# Patient Record
Sex: Male | Born: 1945
Health system: Southern US, Community
[De-identification: ages and names within clinical notes are randomized; demographics above are authoritative.]

## PROBLEM LIST (undated history)

## (undated) DIAGNOSIS — D61818 Other pancytopenia: Secondary | ICD-10-CM

## (undated) DIAGNOSIS — D539 Nutritional anemia, unspecified: Secondary | ICD-10-CM

## (undated) DIAGNOSIS — Z87442 Personal history of urinary calculi: Secondary | ICD-10-CM

## (undated) DIAGNOSIS — D510 Vitamin B12 deficiency anemia due to intrinsic factor deficiency: Secondary | ICD-10-CM

## (undated) DIAGNOSIS — E785 Hyperlipidemia, unspecified: Secondary | ICD-10-CM

## (undated) DIAGNOSIS — E538 Deficiency of other specified B group vitamins: Secondary | ICD-10-CM

## (undated) HISTORY — DX: Nutritional anemia, unspecified: D53.9

## (undated) HISTORY — DX: Hyperlipidemia, unspecified: E78.5

## (undated) HISTORY — DX: Other pancytopenia: D61.818

## (undated) HISTORY — DX: Vitamin B12 deficiency anemia due to intrinsic factor deficiency: D51.0

---

## 2006-04-24 ENCOUNTER — Emergency Department: Payer: Self-pay | Admitting: Emergency Medicine

## 2007-07-01 IMAGING — CR RIGHT FOOT COMPLETE - 3+ VIEW
1 series · 3 of 3 positions shown · non-contrast
Comparison: none

REASON FOR EXAM: pain trauma
COMMENTS:  LMP: (Male)

[Series 1: view not recorded · 0.17mm/px · 3 of 3 slices shown]
[im 1/3]
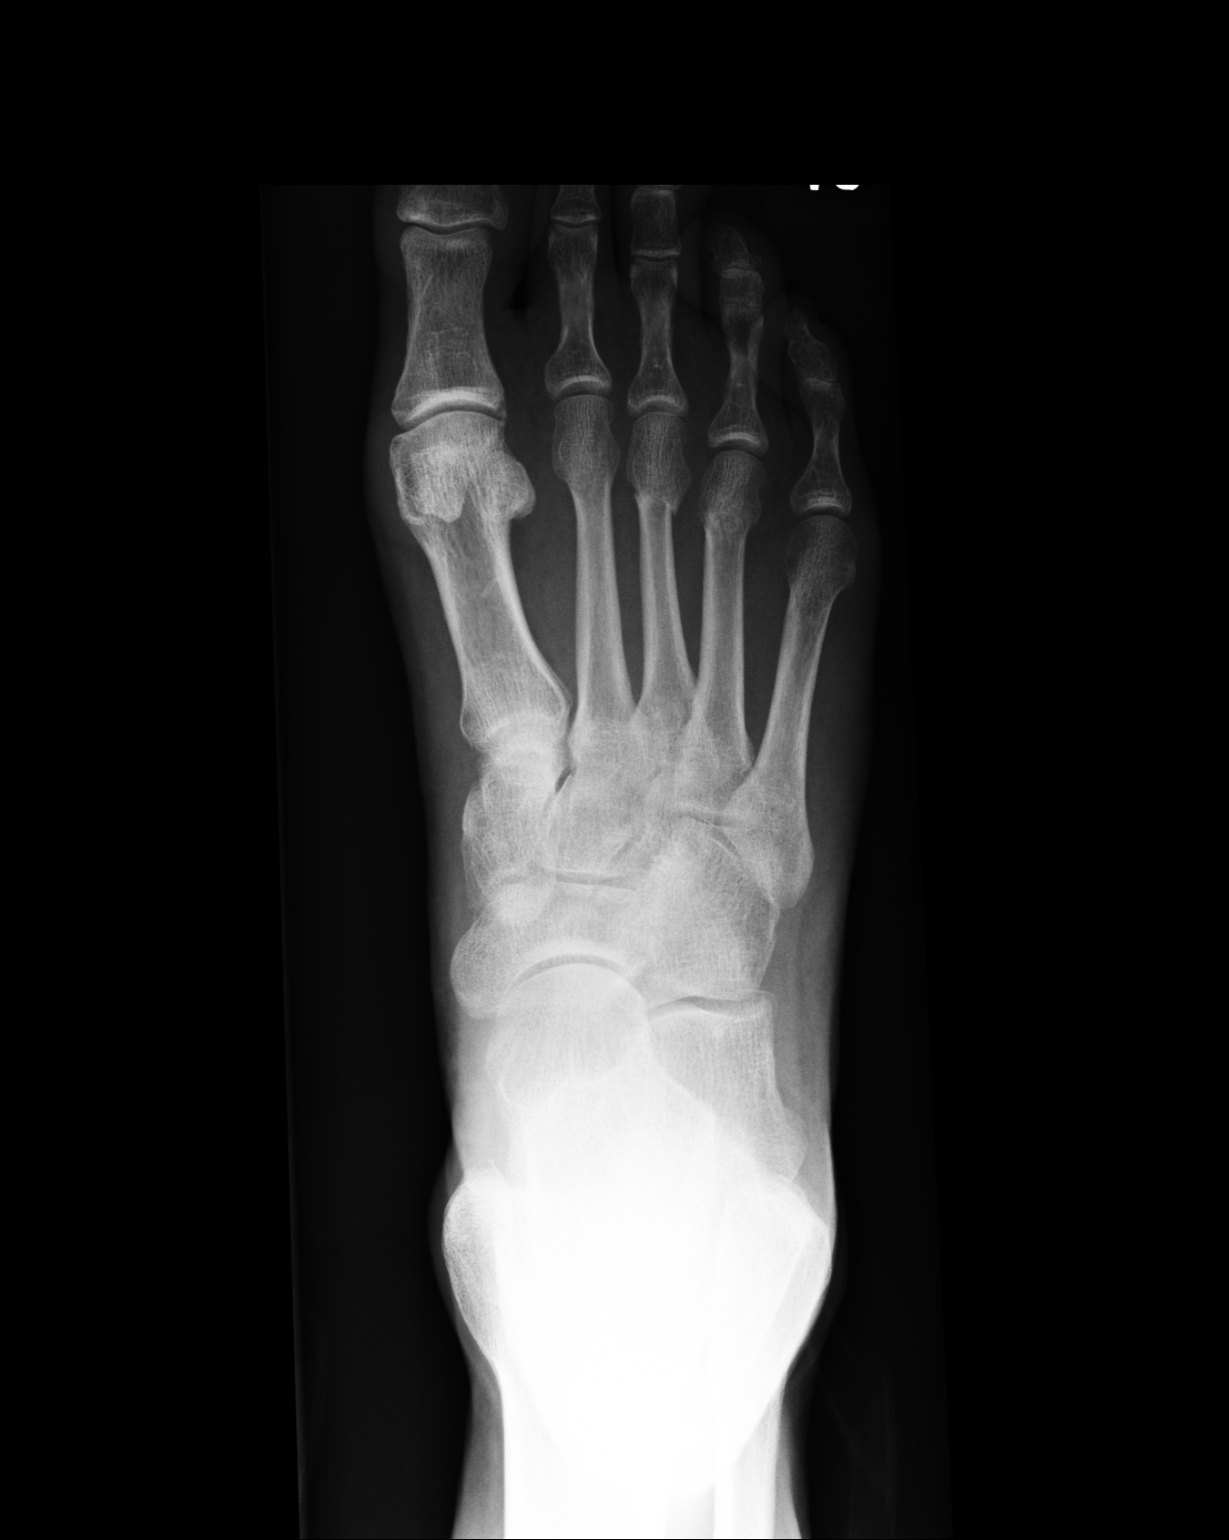
[im 2/3]
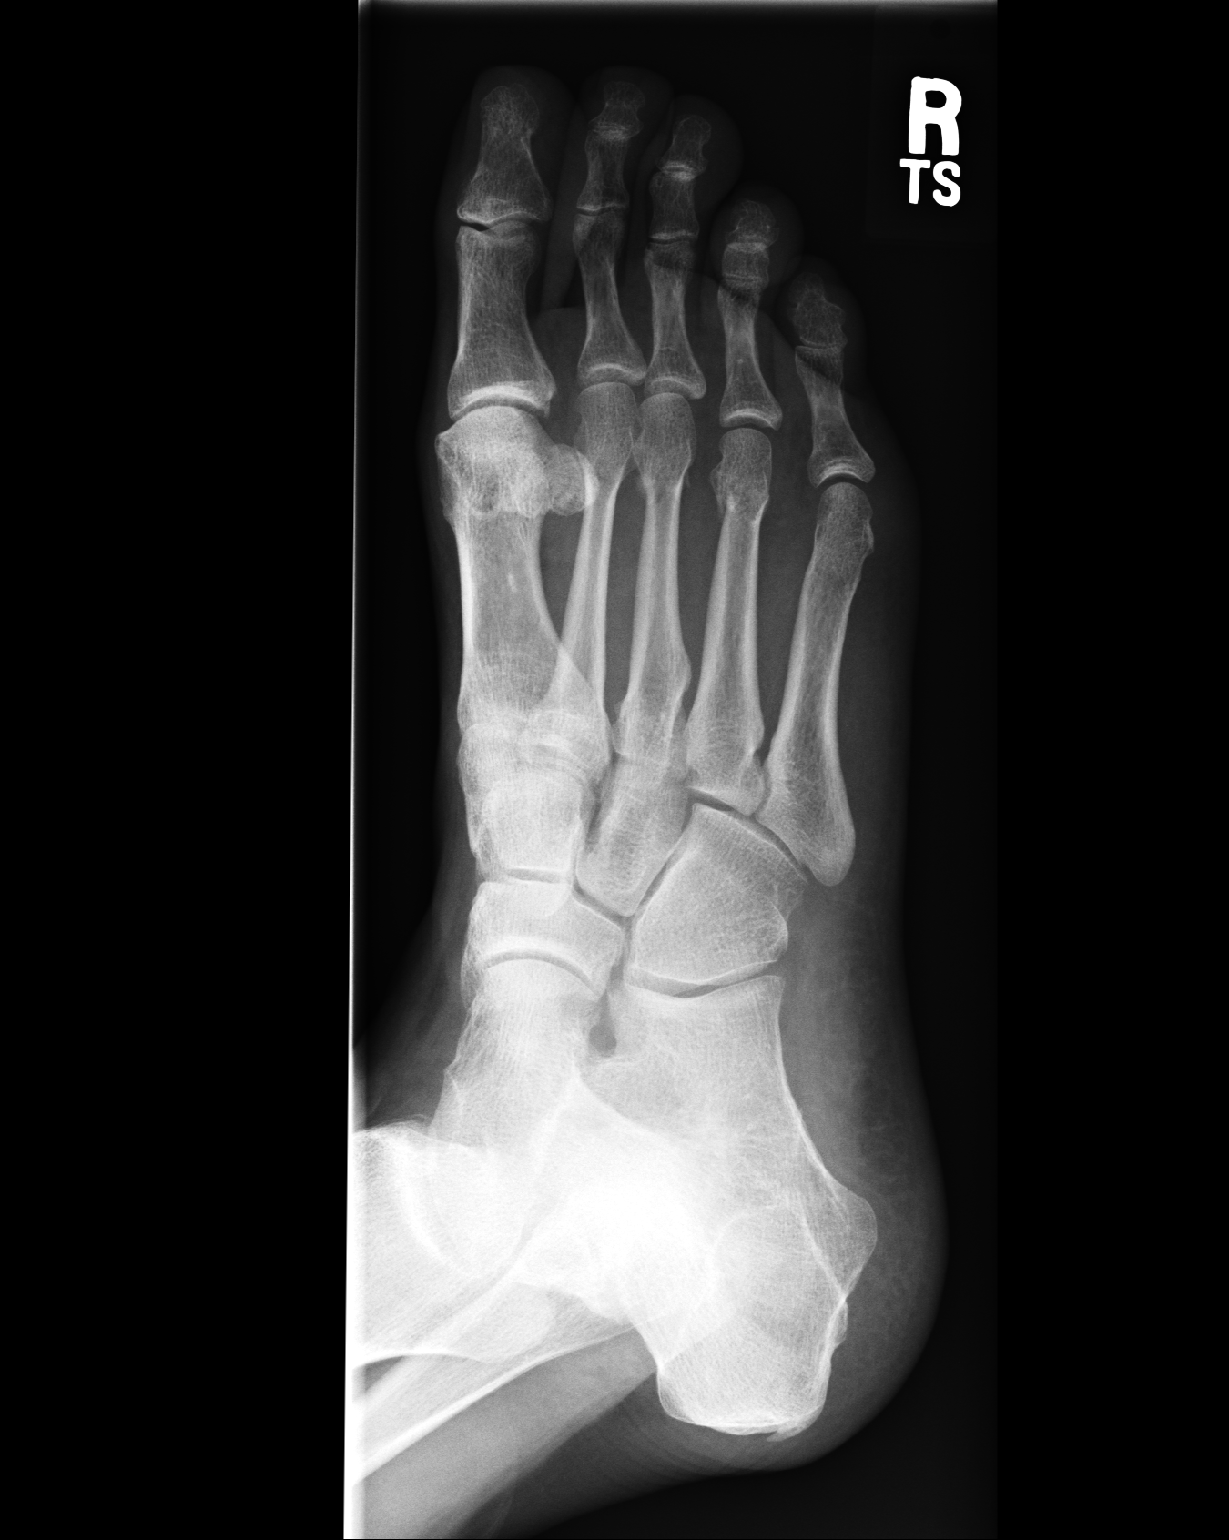
[im 3/3]
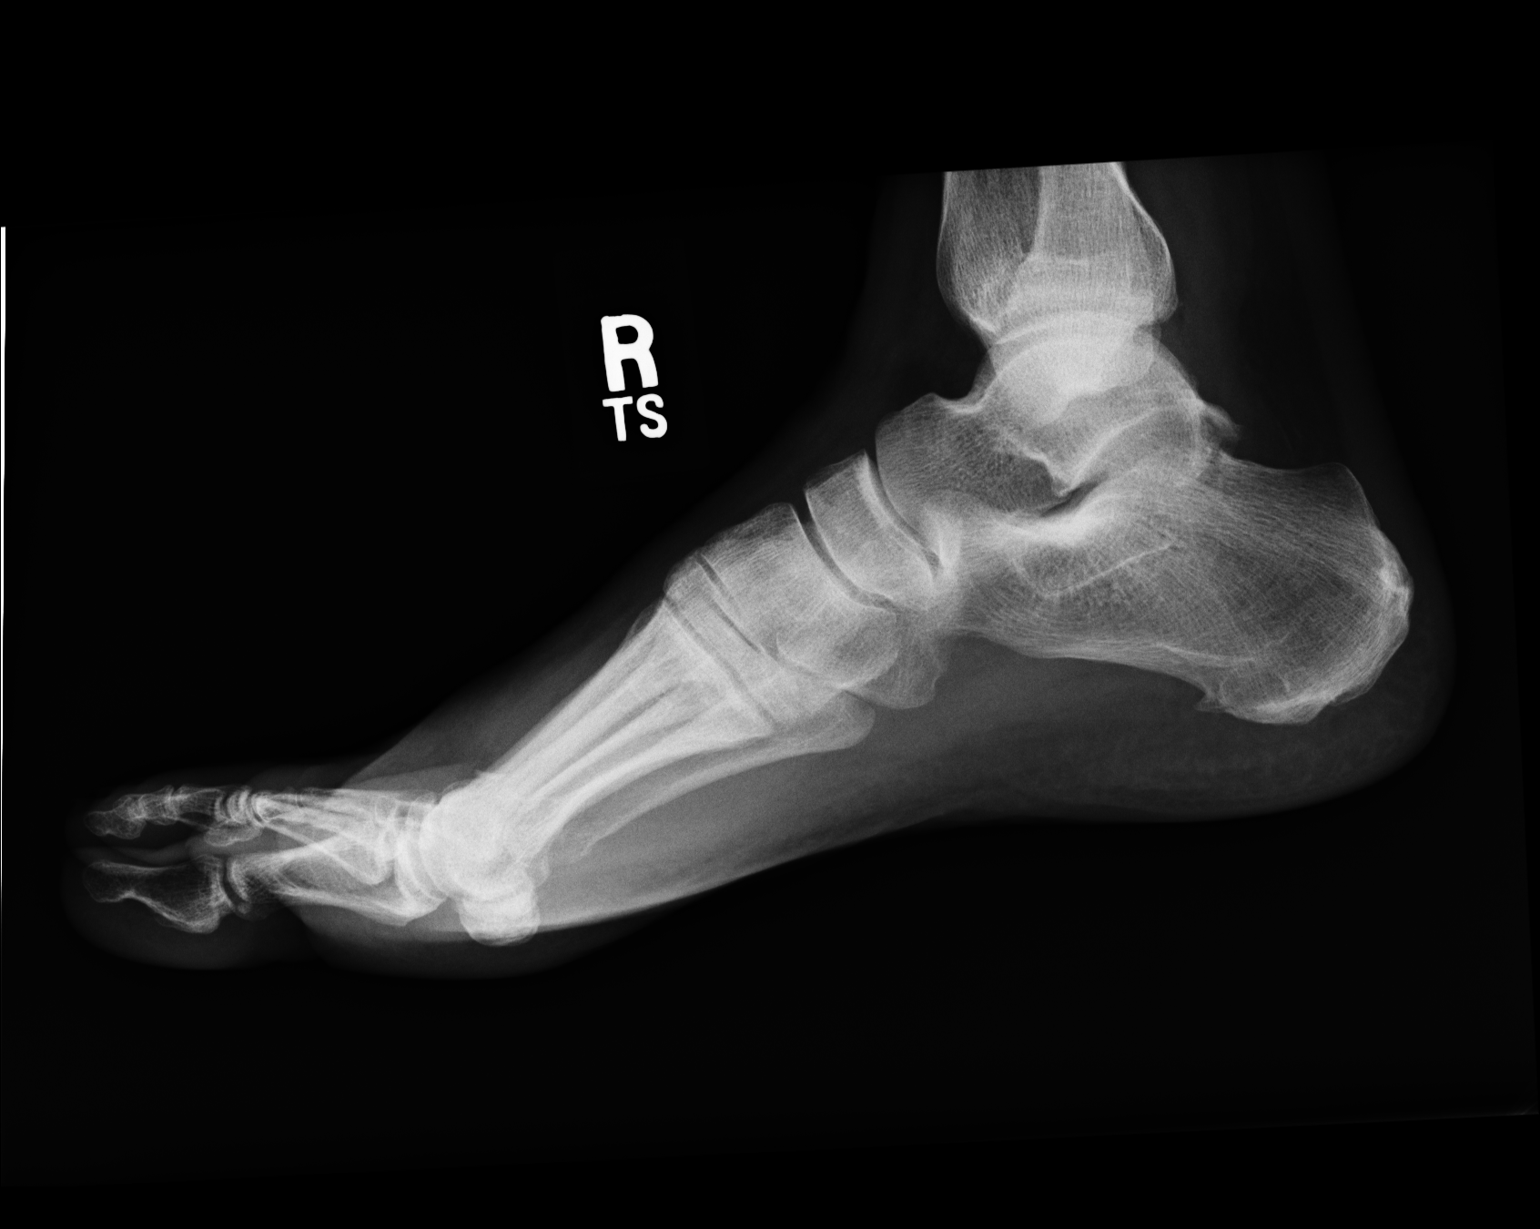

[3 of 3 positions shown; findings below may reference images not displayed]

PROCEDURE:     DXR - DXR FOOT RT COMPLETE W/OBLIQUES  - April 24, 2006  [DATE]

RESULT:          There are noted essentially nondisplaced fractures of the
distal third and fourth metatarsals.  There is a possible hairline fracture
of the distal second metatarsal, but this is not definite.  No other
fractures are seen.
IMPRESSION: Please see above.

## 2007-09-03 ENCOUNTER — Emergency Department: Payer: Self-pay | Admitting: Emergency Medicine

## 2007-09-30 HISTORY — PX: COLONOSCOPY WITH PROPOFOL: SHX5780

## 2007-09-30 LAB — HM COLONOSCOPY: HM Colonoscopy: NORMAL

## 2008-02-28 ENCOUNTER — Ambulatory Visit: Payer: Self-pay | Admitting: Internal Medicine

## 2008-03-12 ENCOUNTER — Inpatient Hospital Stay: Payer: Self-pay | Admitting: *Deleted

## 2008-03-12 ENCOUNTER — Other Ambulatory Visit: Payer: Self-pay

## 2008-03-16 ENCOUNTER — Ambulatory Visit: Payer: Self-pay | Admitting: Internal Medicine

## 2008-03-29 ENCOUNTER — Ambulatory Visit: Payer: Self-pay | Admitting: Internal Medicine

## 2008-04-05 ENCOUNTER — Ambulatory Visit: Payer: Self-pay | Admitting: Gastroenterology

## 2008-04-29 ENCOUNTER — Ambulatory Visit: Payer: Self-pay | Admitting: Internal Medicine

## 2008-05-30 ENCOUNTER — Ambulatory Visit: Payer: Self-pay | Admitting: Internal Medicine

## 2008-11-09 IMAGING — CR RIGHT FOOT COMPLETE - 3+ VIEW
1 series · 3 of 3 positions shown · non-contrast
Comparison: none

REASON FOR EXAM: injury
COMMENTS:

PROCEDURE:     DXR - DXR FOOT RT COMPLETE W/OBLIQUES  - September 03, 2007 [DATE]
RESULT:     There is no evidence of fracture, dislocation, or malalignment.
If there are persistent complaints of pain or persistent clinical concern,
repeat evaluation in 7-10 days is recommended.

[Series 1: view not recorded · 0.17mm/px · 3 of 3 slices shown]
[im 1/3]
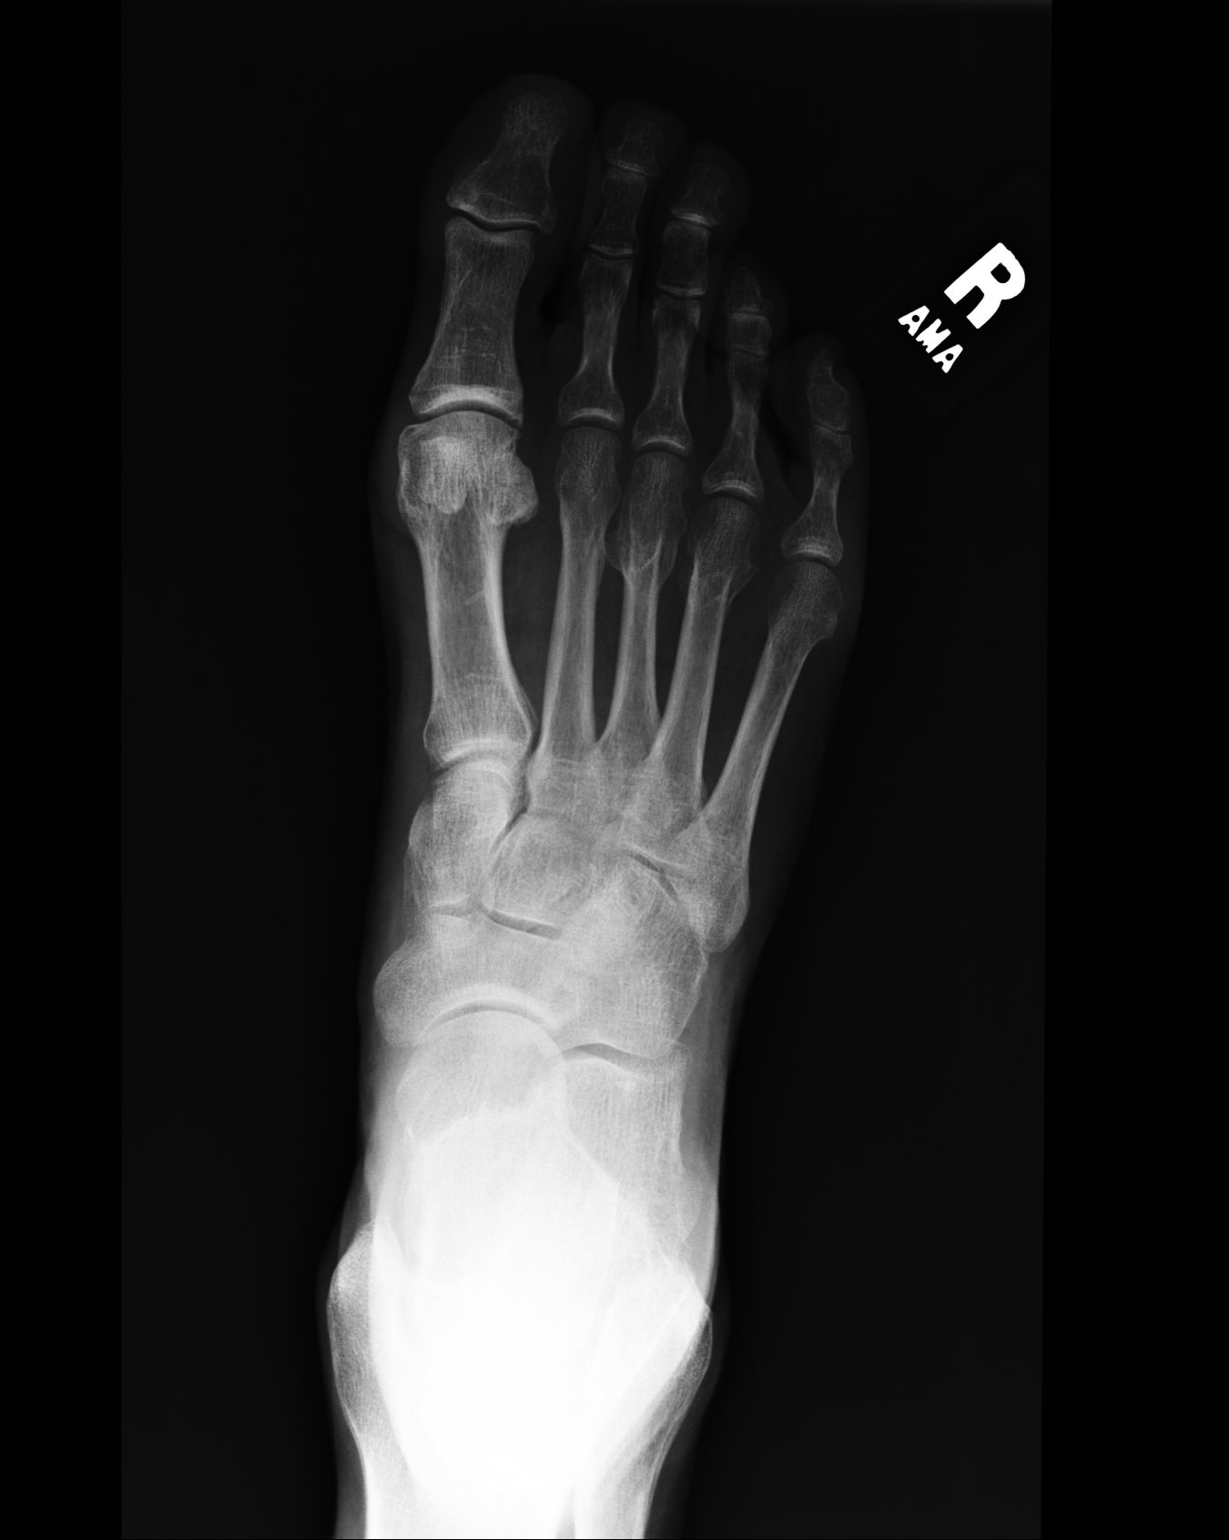
[im 2/3]
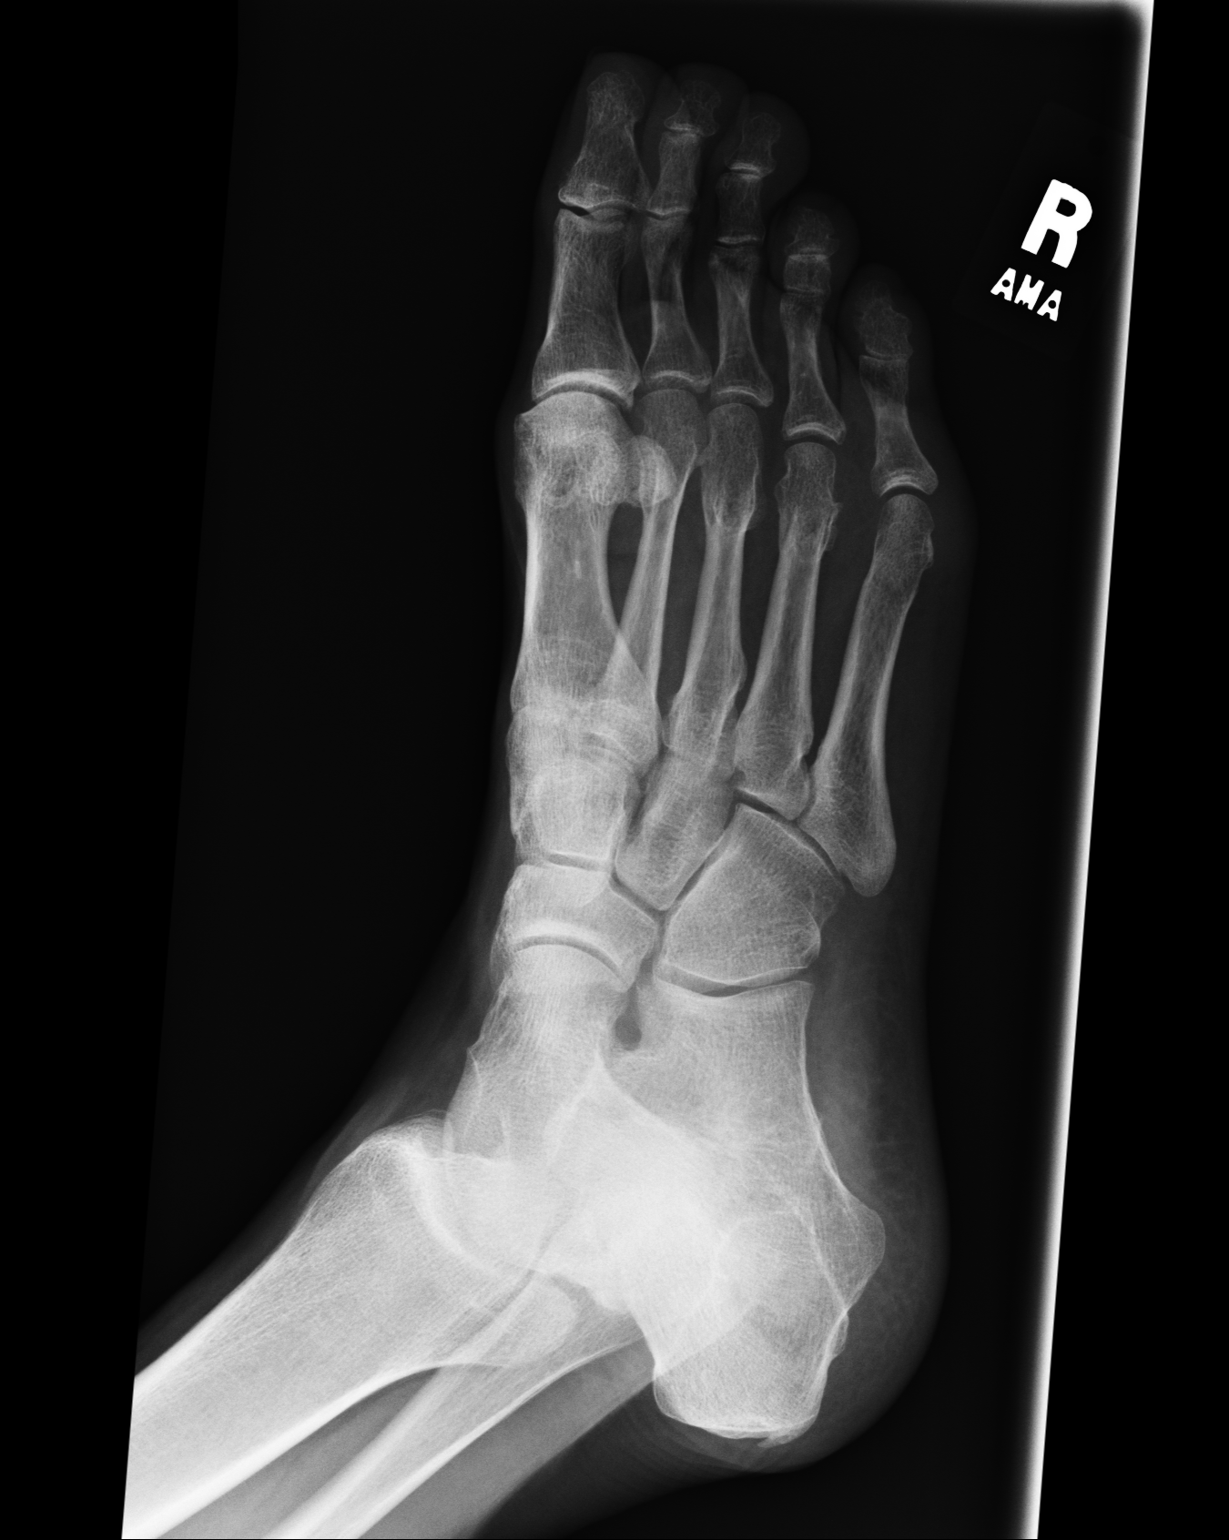
[im 3/3]
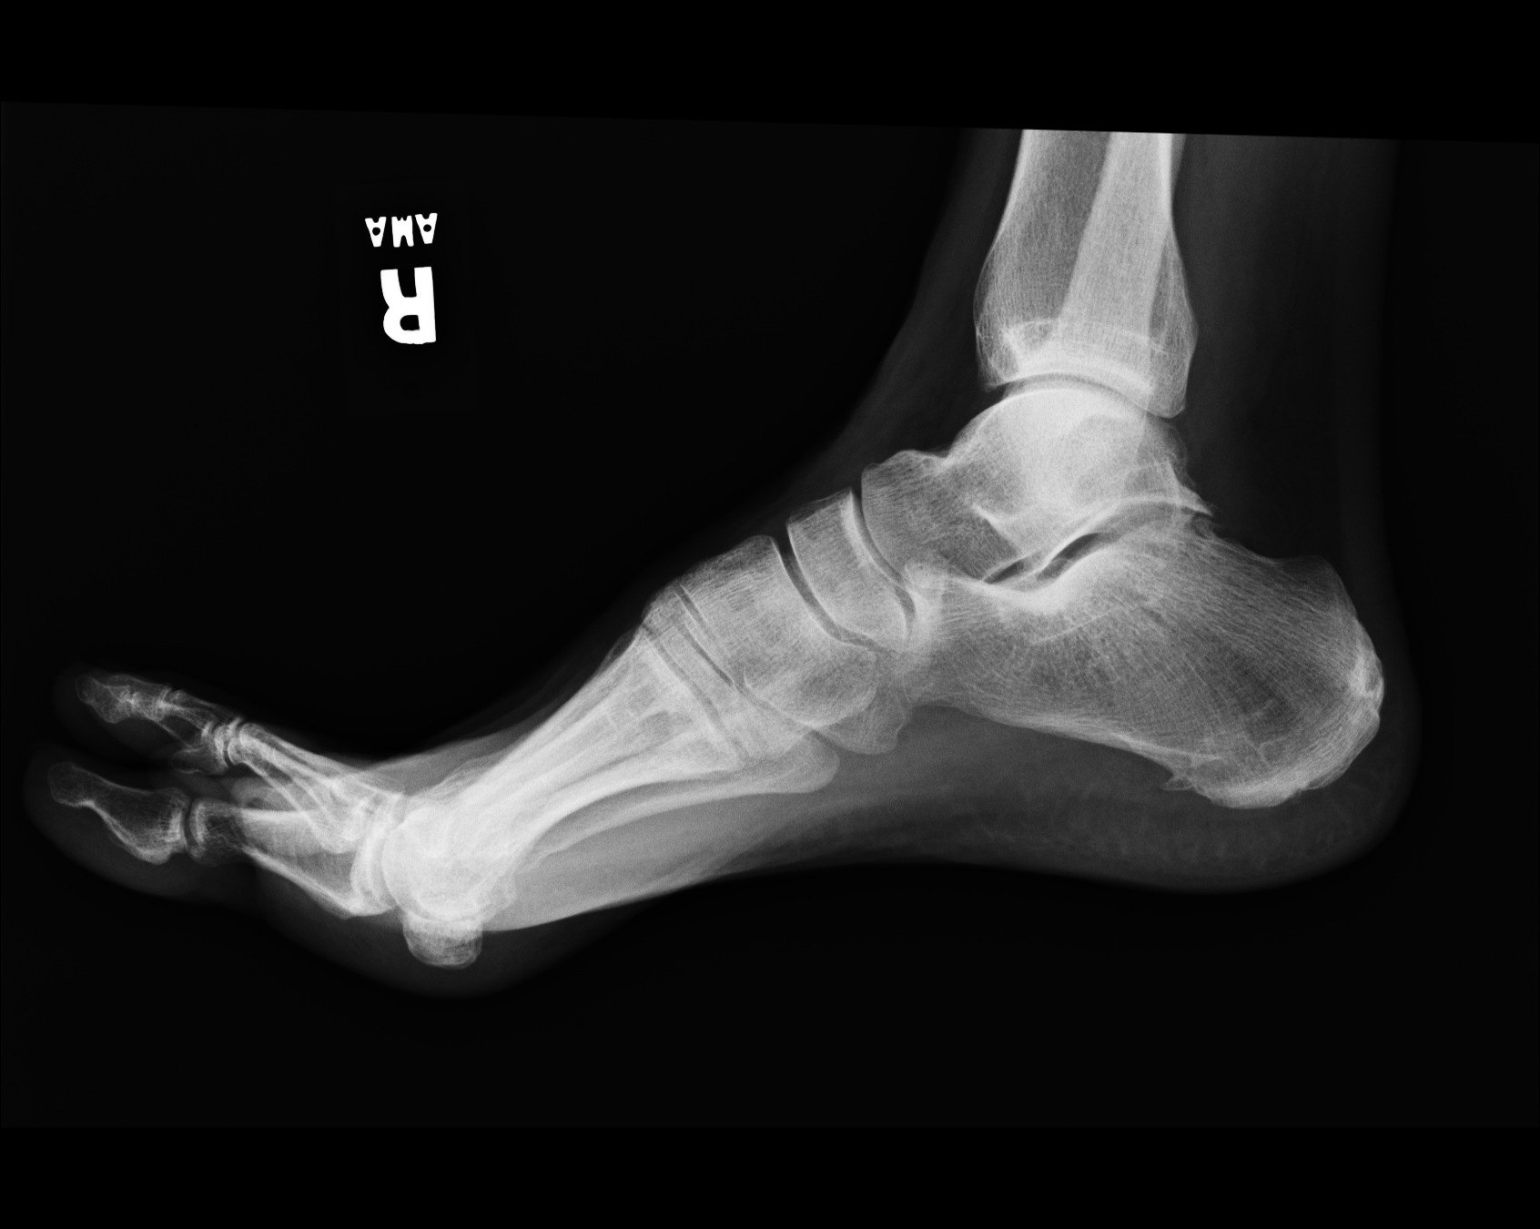

[3 of 3 positions shown; findings below may reference images not displayed]

IMPRESSION: 1)No evidence of focal acute abnormalities of the RIGHT foot as described
above.

## 2009-12-22 ENCOUNTER — Emergency Department: Payer: Self-pay | Admitting: Unknown Physician Specialty

## 2013-05-17 ENCOUNTER — Ambulatory Visit: Payer: Self-pay | Admitting: Family Medicine

## 2013-05-19 ENCOUNTER — Ambulatory Visit: Payer: Self-pay | Admitting: Emergency Medicine

## 2013-05-24 ENCOUNTER — Ambulatory Visit: Payer: Self-pay | Admitting: Family Medicine

## 2015-12-18 NOTE — Telephone Encounter (Signed)
Error

## 2016-01-08 DIAGNOSIS — E785 Hyperlipidemia, unspecified: Secondary | ICD-10-CM | POA: Insufficient documentation

## 2016-01-08 DIAGNOSIS — D510 Vitamin B12 deficiency anemia due to intrinsic factor deficiency: Secondary | ICD-10-CM | POA: Insufficient documentation

## 2016-01-29 ENCOUNTER — Other Ambulatory Visit: Payer: Self-pay | Admitting: Family Medicine

## 2016-01-29 ENCOUNTER — Other Ambulatory Visit: Payer: Self-pay

## 2016-01-29 ENCOUNTER — Encounter: Payer: Self-pay | Admitting: Family Medicine

## 2016-01-29 ENCOUNTER — Ambulatory Visit (INDEPENDENT_AMBULATORY_CARE_PROVIDER_SITE_OTHER): Payer: BLUE CROSS/BLUE SHIELD | Admitting: Family Medicine

## 2016-01-29 VITALS — BP 106/63 | HR 61 | Temp 97.7°F | Ht 68.8 in | Wt 167.0 lb

## 2016-01-29 DIAGNOSIS — R5383 Other fatigue: Secondary | ICD-10-CM

## 2016-01-29 DIAGNOSIS — N400 Enlarged prostate without lower urinary tract symptoms: Secondary | ICD-10-CM

## 2016-01-29 DIAGNOSIS — D510 Vitamin B12 deficiency anemia due to intrinsic factor deficiency: Secondary | ICD-10-CM | POA: Diagnosis not present

## 2016-01-29 DIAGNOSIS — Z0000 Encounter for general adult medical examination without abnormal findings: Secondary | ICD-10-CM

## 2016-01-29 DIAGNOSIS — E785 Hyperlipidemia, unspecified: Secondary | ICD-10-CM | POA: Diagnosis not present

## 2016-01-29 DIAGNOSIS — D500 Iron deficiency anemia secondary to blood loss (chronic): Secondary | ICD-10-CM | POA: Diagnosis not present

## 2016-01-29 MED ORDER — CYANOCOBALAMIN 1000 MCG/ML IJ SOLN: 1000.0000 ug | Freq: Once | INTRAMUSCULAR | Status: DC

## 2016-01-29 NOTE — Progress Notes (Signed)
   BP 106/63 mmHg  Pulse 61  Temp(Src) 97.7 F (36.5 C)  Ht 5' 8.8" (1.748 m)  Wt 167 lb (75.751 kg)  BMI 24.79 kg/m2  SpO2 99%   Subjective:    Patient ID: Donald Savage, male    DOB: 03/07/1946, 70 y.o.   MRN: [id number]  HPI: Donald Savage is a 70 y.o. male  Chief Complaint  Patient presents with  . Annual Exam   Patient with pernicious anemia doing well getting B12 shots once a month without problems Hypercholesterol treating with diet has no family history of coronary artery disease Not taking medications. Otherwise doing well. Relevant past medical, surgical, family and social history reviewed and updated as indicated. Interim medical history since our last visit reviewed. Allergies and medications reviewed and updated.  Review of Systems  Constitutional: Negative.   HENT: Negative.   Eyes: Negative.   Respiratory: Negative.   Cardiovascular: Negative.   Gastrointestinal: Negative.   Endocrine: Negative.   Genitourinary: Negative.   Musculoskeletal: Negative.   Skin: Negative.   Allergic/Immunologic: Negative.   Neurological: Negative.   Hematological: Negative.   Psychiatric/Behavioral: Negative.     Per HPI unless specifically indicated above     Objective:    BP 106/63 mmHg  Pulse 61  Temp(Src) 97.7 F (36.5 C)  Ht 5' 8.8" (1.748 m)  Wt 167 lb (75.751 kg)  BMI 24.79 kg/m2  SpO2 99%  Wt Readings from Last 3 Encounters:  01/29/16 167 lb (75.751 kg)  01/25/15 161 lb (73.029 kg)    Physical Exam  Constitutional: He is oriented to person, place, and time. He appears well-developed and well-nourished.  HENT:  Head: Normocephalic and atraumatic.  Right Ear: External ear normal.  Left Ear: External ear normal.  Eyes: Conjunctivae and EOM are normal. Pupils are equal, round, and reactive to light.  Neck: Normal range of motion. Neck supple.  Cardiovascular: Normal rate, regular rhythm, normal heart sounds and intact distal pulses.    Pulmonary/Chest: Effort normal and breath sounds normal.  Abdominal: Soft. Bowel sounds are normal. There is no splenomegaly or hepatomegaly.  Genitourinary: Rectum normal, prostate normal and penis normal.  Musculoskeletal: Normal range of motion.  Neurological: He is alert and oriented to person, place, and time. He has normal reflexes.  Skin: No rash noted. No erythema.  Psychiatric: He has a normal mood and affect. His behavior is normal. Judgment and thought content normal.    Results for orders placed or performed in visit on 01/08/16  HM COLONOSCOPY  Result Value Ref Range   HM Colonoscopy Patient Reported Normal See Report, Patient Reported Normal      Assessment & Plan:   Problem List Items Addressed This Visit      Other   Hyperlipidemia    Diet controled      Pernicious anemia    Doing well with B12 self injected once a month      Relevant Medications   cyanocobalamin (,VITAMIN B-12,) 1000 MCG/ML injection    Other Visit Diagnoses    Routine general medical examination at a health care facility    -  Primary        Follow up plan: Return in about 1 year (around 01/28/2017) for Physical Exam.

## 2016-01-29 NOTE — Assessment & Plan Note (Signed)
Diet controled 

## 2016-01-29 NOTE — Assessment & Plan Note (Signed)
Doing well with B12 self injected once a month

## 2016-01-30 ENCOUNTER — Telehealth: Payer: Self-pay | Admitting: Family Medicine

## 2016-01-30 DIAGNOSIS — R7989 Other specified abnormal findings of blood chemistry: Secondary | ICD-10-CM

## 2016-01-30 LAB — URINALYSIS, ROUTINE W REFLEX MICROSCOPIC
Bilirubin, UA: NEGATIVE
GLUCOSE, UA: NEGATIVE
Ketones, UA: NEGATIVE
Leukocytes, UA: NEGATIVE
NITRITE UA: NEGATIVE
PH UA: 6.5 (ref 5.0–7.5)
RBC, UA: NEGATIVE
Specific Gravity, UA: 1.02 (ref 1.005–1.030)
UUROB: 1 mg/dL (ref 0.2–1.0)

## 2016-01-30 LAB — MICROSCOPIC EXAMINATION
Epithelial Cells (non renal): NONE SEEN /hpf (ref 0–10)
RBC MICROSCOPIC, UA: NONE SEEN /HPF (ref 0–?)

## 2016-01-30 LAB — CBC WITH DIFFERENTIAL/PLATELET
Basophils Absolute: 0 10*3/uL (ref 0.0–0.2)
Basos: 1 %
EOS (ABSOLUTE): 0.1 10*3/uL (ref 0.0–0.4)
EOS: 1 %
HEMATOCRIT: 40.2 % (ref 37.5–51.0)
HEMOGLOBIN: 13.5 g/dL (ref 12.6–17.7)
Immature Grans (Abs): 0 10*3/uL (ref 0.0–0.1)
Immature Granulocytes: 0 %
LYMPHS ABS: 1.3 10*3/uL (ref 0.7–3.1)
Lymphs: 30 %
MCH: 29.1 pg (ref 26.6–33.0)
MCHC: 33.6 g/dL (ref 31.5–35.7)
MCV: 87 fL (ref 79–97)
MONOCYTES: 8 %
MONOS ABS: 0.3 10*3/uL (ref 0.1–0.9)
NEUTROS ABS: 2.6 10*3/uL (ref 1.4–7.0)
Neutrophils: 60 %
Platelets: 162 10*3/uL (ref 150–379)
RBC: 4.64 x10E6/uL (ref 4.14–5.80)
RDW: 13.8 % (ref 12.3–15.4)
WBC: 4.3 10*3/uL (ref 3.4–10.8)

## 2016-01-30 LAB — COMPREHENSIVE METABOLIC PANEL
A/G RATIO: 1.7 (ref 1.2–2.2)
ALBUMIN: 4.2 g/dL (ref 3.5–4.8)
ALK PHOS: 66 IU/L (ref 39–117)
ALT: 16 IU/L (ref 0–44)
AST: 20 IU/L (ref 0–40)
BILIRUBIN TOTAL: 0.3 mg/dL (ref 0.0–1.2)
BUN / CREAT RATIO: 14 (ref 10–24)
BUN: 16 mg/dL (ref 8–27)
CHLORIDE: 102 mmol/L (ref 96–106)
CO2: 24 mmol/L (ref 18–29)
Calcium: 8.8 mg/dL (ref 8.6–10.2)
Creatinine, Ser: 1.15 mg/dL (ref 0.76–1.27)
GFR calc Af Amer: 73 mL/min/{1.73_m2} (ref >59)
GFR calc non Af Amer: 63 mL/min/{1.73_m2} (ref >59)
GLOBULIN, TOTAL: 2.5 g/dL (ref 1.5–4.5)
Glucose: 84 mg/dL (ref 65–99)
POTASSIUM: 4.2 mmol/L (ref 3.5–5.2)
SODIUM: 142 mmol/L (ref 134–144)
Total Protein: 6.7 g/dL (ref 6.0–8.5)

## 2016-01-30 LAB — PSA: Prostate Specific Ag, Serum: 1.8 ng/mL (ref 0.0–4.0)

## 2016-01-30 LAB — TSH: TSH: 8.1 u[IU]/mL — ABNORMAL HIGH (ref 0.450–4.500)

## 2016-01-30 LAB — LIPID PANEL W/O CHOL/HDL RATIO
CHOLESTEROL TOTAL: 202 mg/dL — AB (ref 100–199)
HDL: 42 mg/dL (ref >39)
LDL Calculated: 121 mg/dL — ABNORMAL HIGH (ref 0–99)
TRIGLYCERIDES: 197 mg/dL — AB (ref 0–149)
VLDL Cholesterol Cal: 39 mg/dL (ref 5–40)

## 2016-01-30 NOTE — Telephone Encounter (Signed)
Phone call Discussed with patient elevated TSH will recheck TSH 2 months or so.

## 2016-02-04 ENCOUNTER — Telehealth: Payer: Self-pay

## 2016-02-04 NOTE — Telephone Encounter (Signed)
Patient did not get his labs drawn after his physical, we would like for him to come back in and have that done.

## 2016-02-04 NOTE — Telephone Encounter (Signed)
Labs were done and resulted, orders entered twice

## 2016-06-11 ENCOUNTER — Encounter: Payer: Self-pay | Admitting: Family Medicine

## 2016-06-11 ENCOUNTER — Ambulatory Visit
Admission: RE | Admit: 2016-06-11 | Discharge: 2016-06-11 | Disposition: A | Discharge status: Home / Self Care | Payer: BLUE CROSS/BLUE SHIELD | Source: Ambulatory Visit | Attending: Family Medicine | Admitting: Family Medicine

## 2016-06-11 ENCOUNTER — Ambulatory Visit (INDEPENDENT_AMBULATORY_CARE_PROVIDER_SITE_OTHER): Payer: BLUE CROSS/BLUE SHIELD | Admitting: Family Medicine

## 2016-06-11 VITALS — BP 126/68 | HR 56 | Temp 98.1°F | Ht 68.0 in | Wt 169.6 lb

## 2016-06-11 DIAGNOSIS — N200 Calculus of kidney: Secondary | ICD-10-CM | POA: Insufficient documentation

## 2016-06-11 DIAGNOSIS — M5136 Other intervertebral disc degeneration, lumbar region: Secondary | ICD-10-CM | POA: Diagnosis not present

## 2016-06-11 DIAGNOSIS — M47896 Other spondylosis, lumbar region: Secondary | ICD-10-CM | POA: Insufficient documentation

## 2016-06-11 DIAGNOSIS — R1010 Upper abdominal pain, unspecified: Secondary | ICD-10-CM

## 2016-06-11 DIAGNOSIS — R109 Unspecified abdominal pain: Secondary | ICD-10-CM

## 2016-06-11 LAB — URINALYSIS, ROUTINE W REFLEX MICROSCOPIC
BILIRUBIN UA: NEGATIVE
Glucose, UA: NEGATIVE
Ketones, UA: NEGATIVE
Leukocytes, UA: NEGATIVE
NITRITE UA: NEGATIVE
PH UA: 5.5 (ref 5.0–7.5)
Protein, UA: NEGATIVE
RBC UA: NEGATIVE
SPEC GRAV UA: 1.02 (ref 1.005–1.030)
UUROB: 1 mg/dL (ref 0.2–1.0)

## 2016-06-11 LAB — CBC WITH DIFFERENTIAL/PLATELET
HEMOGLOBIN: 13.7 g/dL (ref 12.6–17.7)
Hematocrit: 41.2 % (ref 37.5–51.0)
Lymphocytes Absolute: 1.3 10*3/uL (ref 0.7–3.1)
Lymphs: 28 %
MCH: 30.2 pg (ref 26.6–33.0)
MCHC: 33.3 g/dL (ref 31.5–35.7)
MCV: 91 fL (ref 79–97)
MID (ABSOLUTE): 0.4 10*3/uL (ref 0.1–1.6)
MID: 9 %
NEUTROS ABS: 3 10*3/uL (ref 1.4–7.0)
Neutrophils: 63 %
PLATELETS: 155 10*3/uL (ref 150–379)
RBC: 4.54 x10E6/uL (ref 4.14–5.80)
RDW: 13.3 % (ref 12.3–15.4)
WBC: 4.7 10*3/uL (ref 3.4–10.8)

## 2016-06-11 MED ORDER — CYCLOBENZAPRINE HCL 5 MG PO TABS: 5.0000 mg | ORAL_TABLET | Freq: Every day | ORAL | 1 refills | Status: DC

## 2016-06-11 NOTE — Progress Notes (Signed)
BP 126/68 (BP Location: Left Arm, Patient Position: Sitting, Cuff Size: Normal)   Pulse (!) 56   Temp 98.1 F (36.7 C)   Ht 5\' 8"  (1.727 m)   Wt 169 lb 9.6 oz (76.9 kg)   SpO2 95%   BMI 25.79 kg/m    Subjective:    Patient ID: Donald Savage, male    DOB: 05/15/1946, 70 y.o.   MRN: [id number]  HPI: Donald Savage is a 70 y.o. male  Chief Complaint  Patient presents with  . Flank Pain    Only alleviating factor is sitting up. Has been sleeping in a recliner at night.   . Back Pain    Lower right side.   . Nausea  Patient with 2 weeks or so of right lower back pain has been getting somewhat worse sometimes so severe only relief he gets is going to lie down in the recliner can't stay in his bed because it hurts so. Pain doesn't seem to be going on as much during the day as he is busy and doesn't notice it. No noted dietary changes no blood in stool or urine. Maybe a little constipated but has bowel movements no diarrhea. Hasn't noticed any symptoms at work or home with work changing lifting pulling pushing. No radicular symptoms down the right side back in the summer had some left-sided sciatica which is resolved. No rash of shingles.  Relevant past medical, surgical, family and social history reviewed and updated as indicated. Interim medical history since our last visit reviewed. Allergies and medications reviewed and updated.  Review of Systems  Constitutional: Negative for chills, diaphoresis, fatigue, fever and unexpected weight change.  Respiratory: Negative.   Cardiovascular: Negative.  Negative for chest pain, palpitations and leg swelling.  Gastrointestinal: Negative for abdominal distention, abdominal pain, anal bleeding, blood in stool, constipation, diarrhea, nausea, rectal pain and vomiting.  Genitourinary: Positive for flank pain. Negative for difficulty urinating, dysuria, enuresis, frequency, hematuria, testicular pain and urgency.  Skin: Negative for rash.     Per HPI unless specifically indicated above     Objective:    BP 126/68 (BP Location: Left Arm, Patient Position: Sitting, Cuff Size: Normal)   Pulse (!) 56   Temp 98.1 F (36.7 C)   Ht 5\' 8"  (1.727 m)   Wt 169 lb 9.6 oz (76.9 kg)   SpO2 95%   BMI 25.79 kg/m   Wt Readings from Last 3 Encounters:  06/11/16 169 lb 9.6 oz (76.9 kg)  01/29/16 167 lb (75.8 kg)  01/25/15 161 lb (73 kg)    Physical Exam  Constitutional: He is oriented to person, place, and time. He appears well-developed and well-nourished. No distress.  HENT:  Head: Normocephalic and atraumatic.  Right Ear: Hearing normal.  Left Ear: Hearing normal.  Nose: Nose normal.  Eyes: Conjunctivae and lids are normal. Right eye exhibits no discharge. Left eye exhibits no discharge. No scleral icterus.  Cardiovascular: Normal rate, regular rhythm and normal heart sounds.   Pulmonary/Chest: Effort normal and breath sounds normal. No respiratory distress.  Abdominal: Soft. Bowel sounds are normal. He exhibits mass. He exhibits no distension. There is no tenderness. There is no rebound and no guarding.  Musculoskeletal: Normal range of motion. He exhibits no edema, tenderness or deformity.  Neurological: He is alert and oriented to person, place, and time.  Skin: Skin is intact. No rash noted.  Psychiatric: He has a normal mood and affect. His speech is normal and behavior  is normal. Judgment and thought content normal. Cognition and memory are normal.    Results for orders placed or performed in visit on 01/29/16  Microscopic Examination  Result Value Ref Range   WBC, UA 0-5 0 - 5 /hpf   RBC, UA None seen 0 - 2 /hpf   Epithelial Cells (non renal) None seen 0 - 10 /hpf   Bacteria, UA Few (A) None seen/Few  Urinalysis, Routine w reflex microscopic (not at [facility])  Result Value Ref Range   Specific Gravity, UA 1.020 1.005 - 1.030   pH, UA 6.5 5.0 - 7.5   Color, UA Yellow Yellow   Appearance Ur Clear Clear    Leukocytes, UA Negative Negative   Protein, UA Trace (A) Negative/Trace   Glucose, UA Negative Negative   Ketones, UA Negative Negative   RBC, UA Negative Negative   Bilirubin, UA Negative Negative   Urobilinogen, Ur 1.0 0.2 - 1.0 mg/dL   Nitrite, UA Negative Negative   Microscopic Examination See below:       Assessment & Plan:   Problem List Items Addressed This Visit    None    Visit Diagnoses    Flank pain, acute    -  Primary   Discuss flank pain with no specific diagnosis will get x-ray give cyclobenzaprine 5 mg to take on when necessary especially at nighttime and observe response.   Relevant Orders   CBC With Differential/Platelet   Urinalysis, Routine w reflex microscopic (not at [facility])       Follow up plan: Return for As scheduled.

## 2016-06-11 NOTE — Addendum Note (Signed)
Addended byVonita Moss: Junita Kubota on: 06/11/2016 03:33 PM   Modules accepted: Orders

## 2017-01-29 ENCOUNTER — Ambulatory Visit (INDEPENDENT_AMBULATORY_CARE_PROVIDER_SITE_OTHER): Payer: BLUE CROSS/BLUE SHIELD | Admitting: Family Medicine

## 2017-01-29 ENCOUNTER — Encounter: Payer: Self-pay | Admitting: Family Medicine

## 2017-01-29 VITALS — BP 137/73 | HR 68 | Ht 69.29 in | Wt 168.0 lb

## 2017-01-29 DIAGNOSIS — E785 Hyperlipidemia, unspecified: Secondary | ICD-10-CM | POA: Diagnosis not present

## 2017-01-29 DIAGNOSIS — Z1329 Encounter for screening for other suspected endocrine disorder: Secondary | ICD-10-CM | POA: Diagnosis not present

## 2017-01-29 DIAGNOSIS — N400 Enlarged prostate without lower urinary tract symptoms: Secondary | ICD-10-CM | POA: Diagnosis not present

## 2017-01-29 DIAGNOSIS — D510 Vitamin B12 deficiency anemia due to intrinsic factor deficiency: Secondary | ICD-10-CM

## 2017-01-29 DIAGNOSIS — N5089 Other specified disorders of the male genital organs: Secondary | ICD-10-CM | POA: Diagnosis not present

## 2017-01-29 DIAGNOSIS — Z125 Encounter for screening for malignant neoplasm of prostate: Secondary | ICD-10-CM

## 2017-01-29 DIAGNOSIS — Z13220 Encounter for screening for lipoid disorders: Secondary | ICD-10-CM | POA: Diagnosis not present

## 2017-01-29 DIAGNOSIS — Z7189 Other specified counseling: Secondary | ICD-10-CM

## 2017-01-29 DIAGNOSIS — Z0000 Encounter for general adult medical examination without abnormal findings: Secondary | ICD-10-CM | POA: Diagnosis not present

## 2017-01-29 LAB — URINALYSIS, ROUTINE W REFLEX MICROSCOPIC
Bilirubin, UA: NEGATIVE
GLUCOSE, UA: NEGATIVE
Ketones, UA: NEGATIVE
Leukocytes, UA: NEGATIVE
Nitrite, UA: NEGATIVE
PROTEIN UA: NEGATIVE
RBC UA: NEGATIVE
Specific Gravity, UA: 1.02 (ref 1.005–1.030)
UUROB: 0.2 mg/dL (ref 0.2–1.0)
pH, UA: 5.5 (ref 5.0–7.5)

## 2017-01-29 MED ORDER — CYANOCOBALAMIN 1000 MCG/ML IJ SOLN: 1000.0000 ug | Freq: Once | INTRAMUSCULAR | 1 refills | Status: AC

## 2017-01-29 NOTE — Assessment & Plan Note (Signed)
Left testicle slightly enlarged because of discomfort will refer to urology to further evaluate

## 2017-01-29 NOTE — Assessment & Plan Note (Signed)
The current medical regimen is effective;  continue present plan and medications.  

## 2017-01-29 NOTE — Assessment & Plan Note (Signed)
A voluntary discussion about advance care planning including the explanation and discussion of advance directives was extensively discussed  with the patient.  Explanation about the health care proxy and Living will was reviewed and packet with forms with explanation of how to fill them out was given.  .  Patient was offered a separate Advance Care Planning visit for further assistance with forms.    

## 2017-01-29 NOTE — Progress Notes (Signed)
BP 137/73   Pulse 68   Ht 5' 9.29" (1.76 m)   Wt 168 lb (76.2 kg)   SpO2 98%   BMI 24.60 kg/m    Subjective:    Patient ID: Donald Savage, male    DOB: 14-Aug-1946, 71 y.o.   MRN: [id number]  HPI: Donald Savage is a 71 y.o. male  Chief Complaint  Patient presents with  . Annual Exam  AWV metrics met.  Patient concerned as left testicle little discomfort and over the last year or so seems to have doubled in size. No specific pain no blood in stool or urine. Taking B12 injections without problems for pernicious anemia.  Relevant past medical, surgical, family and social history reviewed and updated as indicated. Interim medical history since our last visit reviewed. Allergies and medications reviewed and updated.  Review of Systems  Constitutional: Negative.   HENT: Negative.   Eyes: Negative.   Respiratory: Negative.   Cardiovascular: Negative.   Gastrointestinal: Negative.   Endocrine: Negative.   Genitourinary: Negative.   Musculoskeletal: Negative.   Skin: Negative.   Allergic/Immunologic: Negative.   Neurological: Negative.   Hematological: Negative.   Psychiatric/Behavioral: Negative.     Per HPI unless specifically indicated above     Objective:    BP 137/73   Pulse 68   Ht 5' 9.29" (1.76 m)   Wt 168 lb (76.2 kg)   SpO2 98%   BMI 24.60 kg/m   Wt Readings from Last 3 Encounters:  01/29/17 168 lb (76.2 kg)  06/11/16 169 lb 9.6 oz (76.9 kg)  01/29/16 167 lb (75.8 kg)    Physical Exam  Constitutional: He is oriented to person, place, and time. He appears well-developed and well-nourished.  HENT:  Head: Normocephalic and atraumatic.  Right Ear: External ear normal.  Left Ear: External ear normal.  Eyes: Conjunctivae and EOM are normal. Pupils are equal, round, and reactive to light.  Neck: Normal range of motion. Neck supple.  Cardiovascular: Normal rate, regular rhythm, normal heart sounds and intact distal pulses.   Pulmonary/Chest: Effort  normal and breath sounds normal.  Abdominal: Soft. Bowel sounds are normal. There is no splenomegaly or hepatomegaly.  Genitourinary: Rectum normal, prostate normal and penis normal.  Genitourinary Comments: Enlarged lt testicle  Musculoskeletal: Normal range of motion.  Neurological: He is alert and oriented to person, place, and time. He has normal reflexes.  Skin: No rash noted. No erythema.  Psychiatric: He has a normal mood and affect. His behavior is normal. Judgment and thought content normal.    Results for orders placed or performed in visit on 06/11/16  CBC With Differential/Platelet  Result Value Ref Range   WBC 4.7 3.4 - 10.8 x10E3/uL   RBC 4.54 4.14 - 5.80 x10E6/uL   Hemoglobin 13.7 12.6 - 17.7 g/dL   Hematocrit 41.2 37.5 - 51.0 %   MCV 91 79 - 97 fL   MCH 30.2 26.6 - 33.0 pg   MCHC 33.3 31.5 - 35.7 g/dL   RDW 13.3 12.3 - 15.4 %   Platelets 155 150 - 379 x10E3/uL   Neutrophils 63 %   Lymphs 28 %   MID 9 %   Neutrophils Absolute 3.0 1.4 - 7.0 x10E3/uL   Lymphocytes Absolute 1.3 0.7 - 3.1 x10E3/uL   MID (Absolute) 0.4 0.1 - 1.6 X10E3/uL  Urinalysis, Routine w reflex microscopic (not at [facility])  Result Value Ref Range   Specific Gravity, UA 1.020 1.005 - 1.030  pH, UA 5.5 5.0 - 7.5   Color, UA Yellow Yellow   Appearance Ur Clear Clear   Leukocytes, UA Negative Negative   Protein, UA Negative Negative/Trace   Glucose, UA Negative Negative   Ketones, UA Negative Negative   RBC, UA Negative Negative   Bilirubin, UA Negative Negative   Urobilinogen, Ur 1.0 0.2 - 1.0 mg/dL   Nitrite, UA Negative Negative      Assessment & Plan:   Problem List Items Addressed This Visit      Genitourinary   BPH (benign prostatic hyperplasia)     Other   Hyperlipidemia   Relevant Orders   CBC with Differential/Platelet   Comprehensive metabolic panel   Lipid panel   Urinalysis, Routine w reflex microscopic   Pernicious anemia    The current medical regimen is effective;   continue present plan and medications.       Relevant Medications   cyanocobalamin (,VITAMIN B-12,) 1000 MCG/ML injection   Enlarged testicle    Left testicle slightly enlarged because of discomfort will refer to urology to further evaluate      Relevant Orders   Ambulatory referral to Urology   Advanced care planning/counseling discussion    A voluntary discussion about advance care planning including the explanation and discussion of advance directives was extensively discussed  with the patient.  Explanation about the health care proxy and Living will was reviewed and packet with forms with explanation of how to fill them out was given. .  Patient was offered a separate [place] visit for further assistance with forms.          Other Visit Diagnoses    Annual physical exam    -  Primary   Relevant Orders   CBC with Differential/Platelet   Comprehensive metabolic panel   Lipid panel   PSA   TSH   Urinalysis, Routine w reflex microscopic   Screening cholesterol level       Relevant Orders   Lipid panel   Prostate cancer screening       Relevant Orders   PSA   Thyroid disorder screen       Relevant Orders   TSH       Follow up plan: Return in about 1 year (around 01/29/2018) for Physical Exam.

## 2017-01-30 LAB — CBC WITH DIFFERENTIAL/PLATELET
BASOS ABS: 0 10*3/uL (ref 0.0–0.2)
Basos: 0 %
EOS (ABSOLUTE): 0.1 10*3/uL (ref 0.0–0.4)
Eos: 2 %
Hematocrit: 40 % (ref 37.5–51.0)
Hemoglobin: 13.2 g/dL (ref 13.0–17.7)
IMMATURE GRANS (ABS): 0 10*3/uL (ref 0.0–0.1)
Immature Granulocytes: 0 %
LYMPHS: 33 %
Lymphocytes Absolute: 1.5 10*3/uL (ref 0.7–3.1)
MCH: 29 pg (ref 26.6–33.0)
MCHC: 33 g/dL (ref 31.5–35.7)
MCV: 88 fL (ref 79–97)
Monocytes Absolute: 0.2 10*3/uL (ref 0.1–0.9)
Monocytes: 5 %
NEUTROS ABS: 2.7 10*3/uL (ref 1.4–7.0)
Neutrophils: 60 %
PLATELETS: 164 10*3/uL (ref 150–379)
RBC: 4.55 x10E6/uL (ref 4.14–5.80)
RDW: 13.5 % (ref 12.3–15.4)
WBC: 4.5 10*3/uL (ref 3.4–10.8)

## 2017-01-30 LAB — COMPREHENSIVE METABOLIC PANEL
A/G RATIO: 1.8 (ref 1.2–2.2)
ALBUMIN: 4.1 g/dL (ref 3.5–4.8)
ALK PHOS: 68 IU/L (ref 39–117)
ALT: 18 IU/L (ref 0–44)
AST: 23 IU/L (ref 0–40)
BILIRUBIN TOTAL: 0.3 mg/dL (ref 0.0–1.2)
BUN / CREAT RATIO: 13 (ref 10–24)
BUN: 16 mg/dL (ref 8–27)
CHLORIDE: 104 mmol/L (ref 96–106)
CO2: 26 mmol/L (ref 18–29)
Calcium: 9.3 mg/dL (ref 8.6–10.2)
Creatinine, Ser: 1.22 mg/dL (ref 0.76–1.27)
GFR calc non Af Amer: 58 mL/min/{1.73_m2} — ABNORMAL LOW (ref >59)
GFR, EST AFRICAN AMERICAN: 68 mL/min/{1.73_m2} (ref >59)
GLUCOSE: 82 mg/dL (ref 65–99)
Globulin, Total: 2.3 g/dL (ref 1.5–4.5)
POTASSIUM: 4.2 mmol/L (ref 3.5–5.2)
Sodium: 143 mmol/L (ref 134–144)
Total Protein: 6.4 g/dL (ref 6.0–8.5)

## 2017-01-30 LAB — LIPID PANEL
CHOLESTEROL TOTAL: 200 mg/dL — AB (ref 100–199)
Chol/HDL Ratio: 4.9 ratio (ref 0.0–5.0)
HDL: 41 mg/dL (ref >39)
LDL Calculated: 125 mg/dL — ABNORMAL HIGH (ref 0–99)
Triglycerides: 171 mg/dL — ABNORMAL HIGH (ref 0–149)
VLDL CHOLESTEROL CAL: 34 mg/dL (ref 5–40)

## 2017-01-30 LAB — TSH: TSH: 8.96 u[IU]/mL — ABNORMAL HIGH (ref 0.450–4.500)

## 2017-01-30 LAB — PSA: PROSTATE SPECIFIC AG, SERUM: 1.9 ng/mL (ref 0.0–4.0)

## 2017-02-02 ENCOUNTER — Telehealth: Payer: Self-pay | Admitting: Family Medicine

## 2017-02-02 DIAGNOSIS — R7989 Other specified abnormal findings of blood chemistry: Secondary | ICD-10-CM

## 2017-02-02 NOTE — Telephone Encounter (Signed)
Phone call Discussed with patient elevated TSH patient with no low thyroid symptoms will recheck TSH in couple of months.

## 2017-02-09 DIAGNOSIS — R102 Pelvic and perineal pain: Secondary | ICD-10-CM | POA: Diagnosis not present

## 2017-02-09 DIAGNOSIS — R3914 Feeling of incomplete bladder emptying: Secondary | ICD-10-CM | POA: Diagnosis not present

## 2017-02-09 DIAGNOSIS — N432 Other hydrocele: Secondary | ICD-10-CM | POA: Diagnosis not present

## 2017-08-18 IMAGING — DX DG LUMBAR SPINE 2-3V
3 series · 3 of 3 positions shown · non-contrast
Comparison: None.

CLINICAL DATA: Right-sided low back pain worse at night. Symptoms 2
weeks.

EXAM:
LUMBAR SPINE - 2-3 VIEW

[l-spine ap]
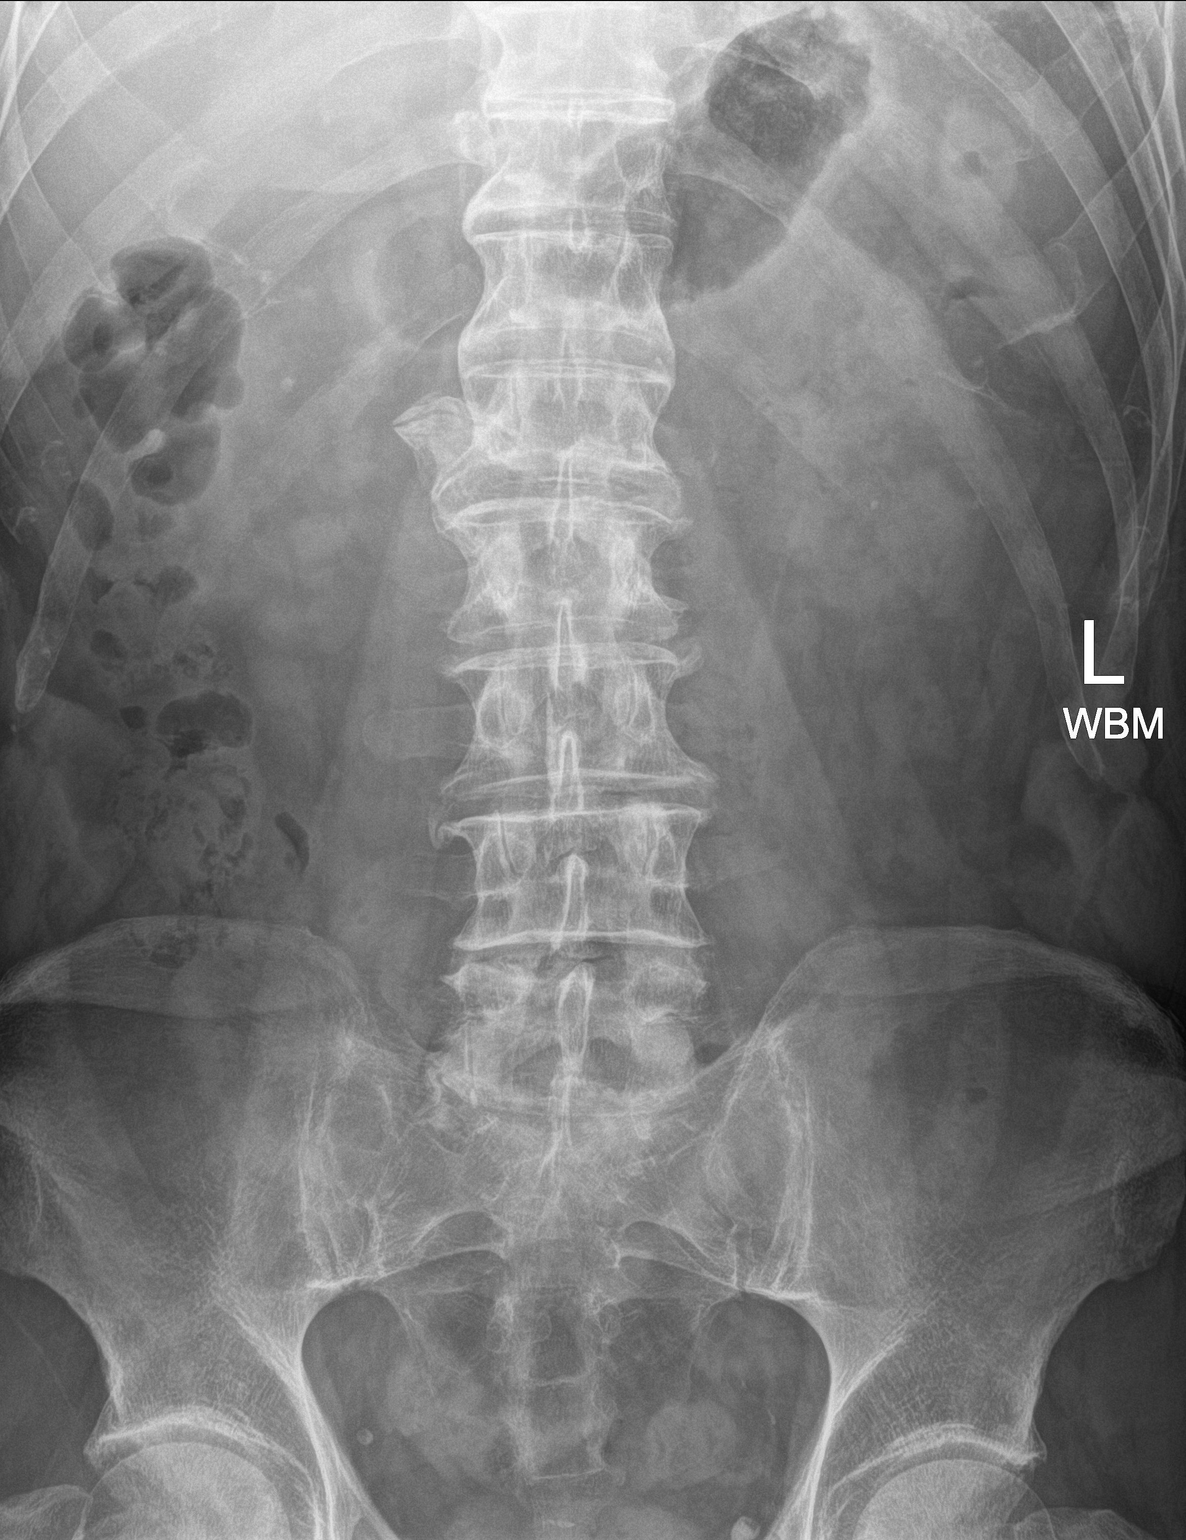

[l-spine lat]
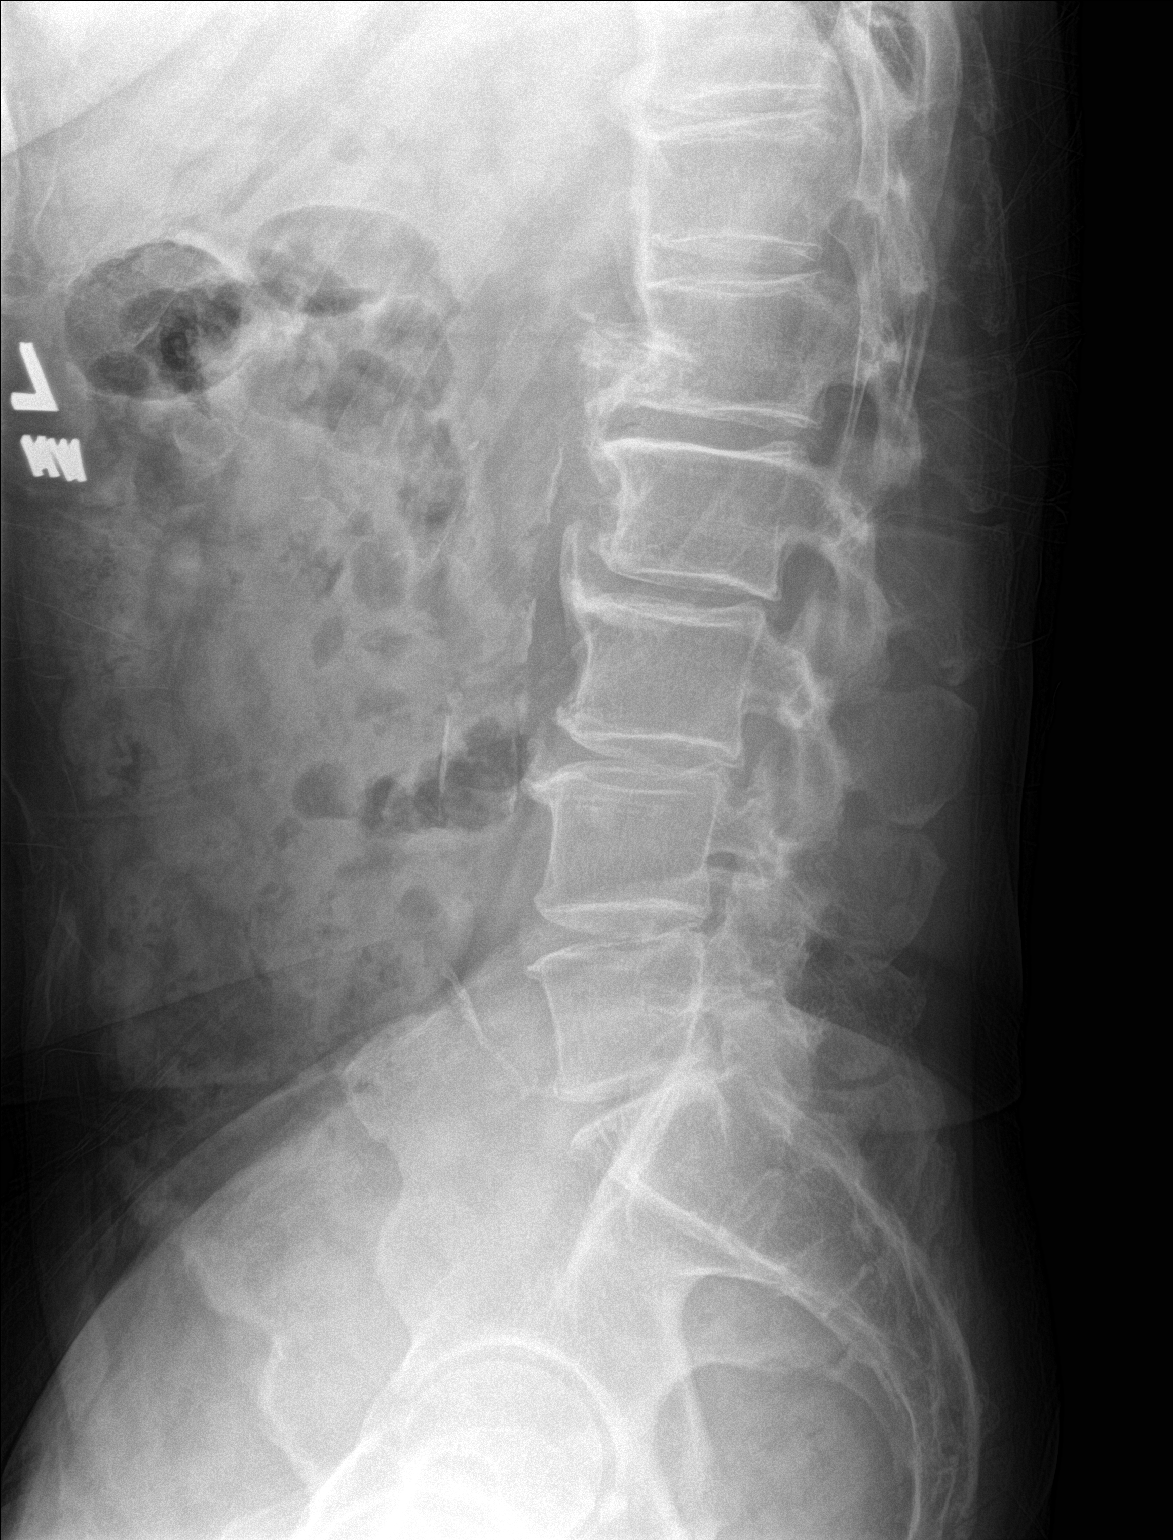

[l-spine spot]
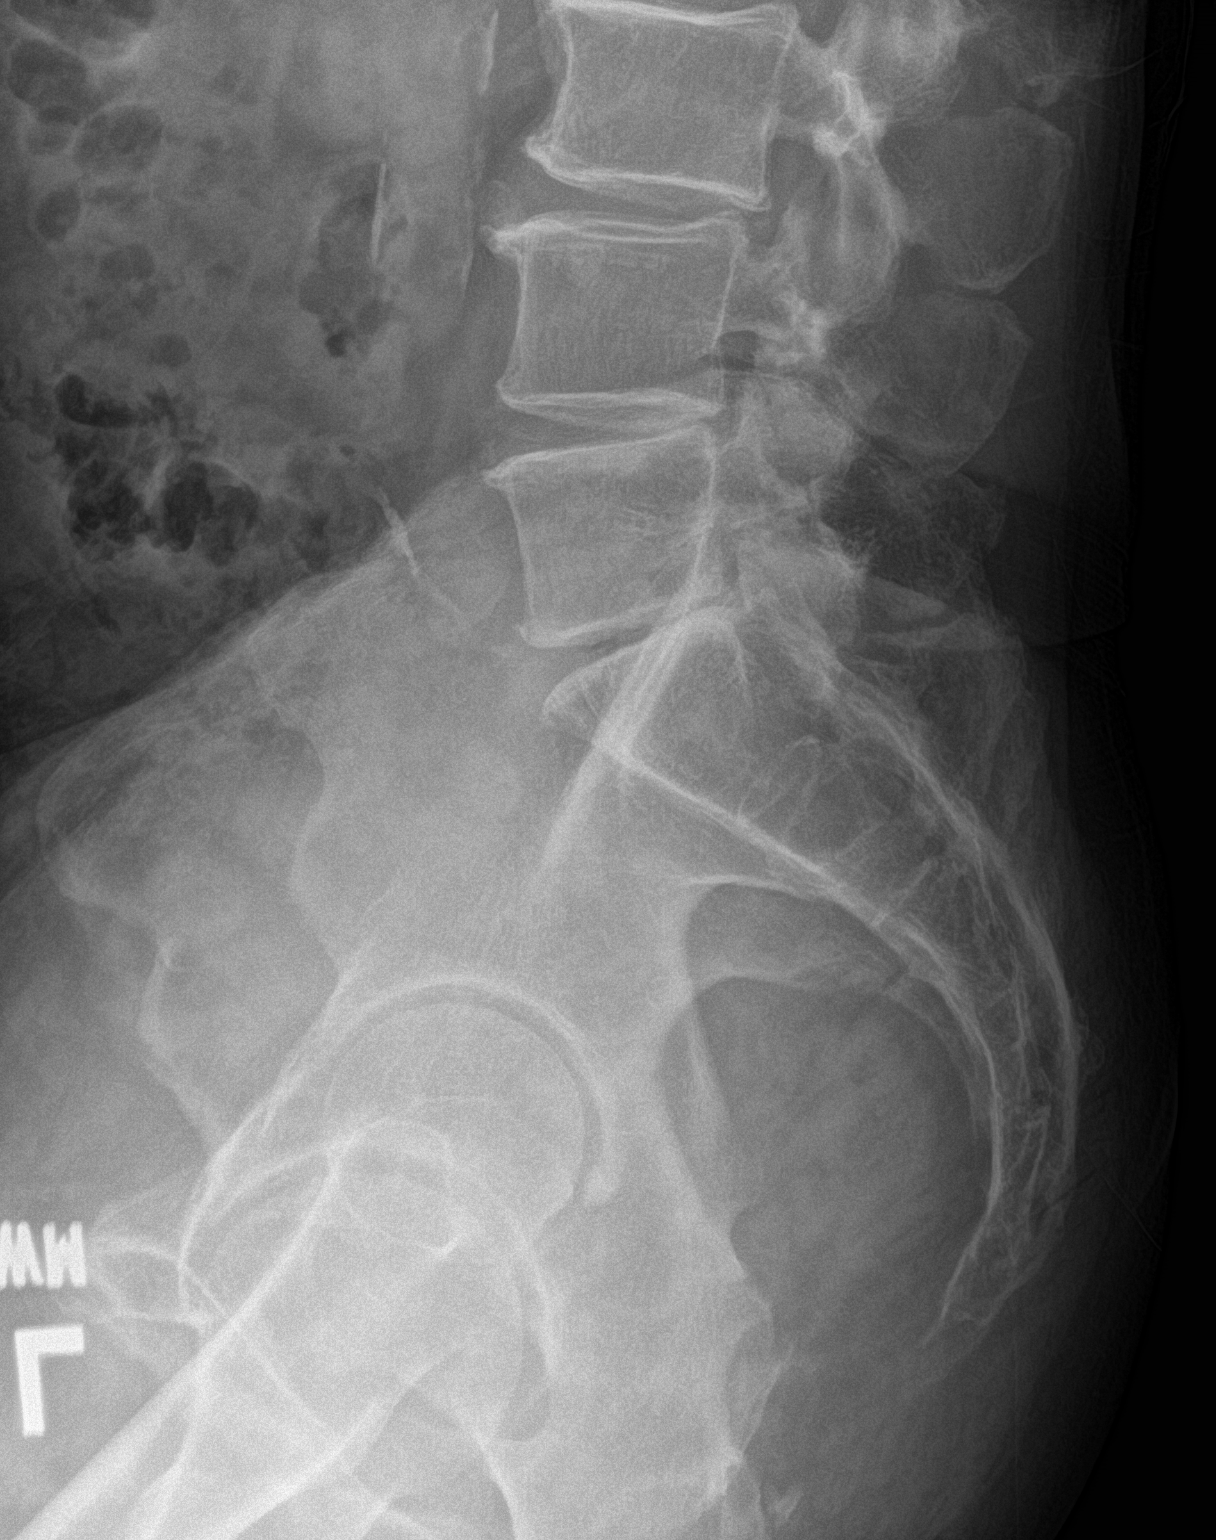

[3 of 3 positions shown; findings below may reference images not displayed]

FINDINGS: Vertebral body alignment and heights are within normal. There is
moderate spondylosis of the lumbar spine. Minimal disc space
narrowing throughout the lumbar spine. No evidence of compression
fracture or subluxation. Facet arthropathy is present. Prominent
right-sided paravertebral osteophyte at the L1-2 level. Possible
small bilateral renal stones.
IMPRESSION: Mild spondylosis throughout the lumbar spine with mild multilevel
disc disease.

Possible bilateral mild nephrolithiasis.

## 2017-11-26 ENCOUNTER — Telehealth: Payer: Self-pay

## 2017-11-26 NOTE — Telephone Encounter (Signed)
Copied from CRM [id number]. Topic: Medicare AWV >> Nov 26, 2017  1:31 PM [place], [place] A, LPN wrote: Reason for CRM: Called to schedule medicare annual wellness visit with [facility] Please schedule as soon as soon as possible or before CPE in May Any questions please contact Bedelia PersonKathyrn Brown on skype or by phone- [phone]

## 2018-01-29 ENCOUNTER — Other Ambulatory Visit: Payer: Self-pay | Admitting: Nurse Practitioner

## 2018-01-29 DIAGNOSIS — N50812 Left testicular pain: Secondary | ICD-10-CM

## 2018-02-01 ENCOUNTER — Encounter: Payer: BLUE CROSS/BLUE SHIELD | Admitting: Family Medicine

## 2018-02-02 ENCOUNTER — Ambulatory Visit
Admission: RE | Admit: 2018-02-02 | Discharge: 2018-02-02 | Disposition: A | Discharge status: Home / Self Care | Payer: Managed Care, Other (non HMO) | Source: Ambulatory Visit | Attending: Nurse Practitioner | Admitting: Nurse Practitioner

## 2018-02-02 DIAGNOSIS — N433 Hydrocele, unspecified: Secondary | ICD-10-CM | POA: Diagnosis not present

## 2018-02-02 DIAGNOSIS — N503 Cyst of epididymis: Secondary | ICD-10-CM | POA: Diagnosis not present

## 2018-02-02 DIAGNOSIS — N50812 Left testicular pain: Secondary | ICD-10-CM | POA: Diagnosis not present

## 2019-04-08 ENCOUNTER — Telehealth: Payer: Self-pay | Admitting: Urology

## 2019-04-08 ENCOUNTER — Other Ambulatory Visit: Payer: Self-pay

## 2019-04-08 ENCOUNTER — Emergency Department
Admission: EM | Admit: 2019-04-08 | Discharge: 2019-04-08 | Disposition: A | Discharge status: Home / Self Care | Payer: Managed Care, Other (non HMO) | Attending: Emergency Medicine | Admitting: Emergency Medicine

## 2019-04-08 ENCOUNTER — Emergency Department: Payer: Managed Care, Other (non HMO)

## 2019-04-08 DIAGNOSIS — N201 Calculus of ureter: Secondary | ICD-10-CM | POA: Insufficient documentation

## 2019-04-08 DIAGNOSIS — Z79899 Other long term (current) drug therapy: Secondary | ICD-10-CM | POA: Diagnosis not present

## 2019-04-08 DIAGNOSIS — R109 Unspecified abdominal pain: Secondary | ICD-10-CM | POA: Diagnosis present

## 2019-04-08 LAB — CBC
HCT: 42.7 % (ref 39.0–52.0)
Hemoglobin: 14 g/dL (ref 13.0–17.0)
MCH: 29.1 pg (ref 26.0–34.0)
MCHC: 32.8 g/dL (ref 30.0–36.0)
MCV: 88.8 fL (ref 80.0–100.0)
Platelets: 179 10*3/uL (ref 150–400)
RBC: 4.81 MIL/uL (ref 4.22–5.81)
RDW: 12.4 % (ref 11.5–15.5)
WBC: 8.3 10*3/uL (ref 4.0–10.5)
nRBC: 0 % (ref 0.0–0.2)

## 2019-04-08 LAB — URINALYSIS, COMPLETE (UACMP) WITH MICROSCOPIC
Bacteria, UA: NONE SEEN
Bilirubin Urine: NEGATIVE
Glucose, UA: NEGATIVE mg/dL
Ketones, ur: NEGATIVE mg/dL
Leukocytes,Ua: NEGATIVE
Nitrite: NEGATIVE
Protein, ur: NEGATIVE mg/dL
RBC / HPF: >50 RBC/hpf — ABNORMAL HIGH (ref 0–5)
Specific Gravity, Urine: 1.013 (ref 1.005–1.030)
Squamous Epithelial / LPF: NONE SEEN (ref 0–5)
pH: 8 (ref 5.0–8.0)

## 2019-04-08 LAB — COMPREHENSIVE METABOLIC PANEL
ALT: 15 U/L (ref 0–44)
AST: 24 U/L (ref 15–41)
Albumin: 4.2 g/dL (ref 3.5–5.0)
Alkaline Phosphatase: 61 U/L (ref 38–126)
Anion gap: 11 (ref 5–15)
BUN: 20 mg/dL (ref 8–23)
CO2: 23 mmol/L (ref 22–32)
Calcium: 9.6 mg/dL (ref 8.9–10.3)
Chloride: 107 mmol/L (ref 98–111)
Creatinine, Ser: 1.48 mg/dL — ABNORMAL HIGH (ref 0.61–1.24)
GFR calc Af Amer: 53 mL/min — ABNORMAL LOW (ref >60)
GFR calc non Af Amer: 46 mL/min — ABNORMAL LOW (ref >60)
Glucose, Bld: 180 mg/dL — ABNORMAL HIGH (ref 70–99)
Potassium: 4 mmol/L (ref 3.5–5.1)
Sodium: 141 mmol/L (ref 135–145)
Total Bilirubin: 0.8 mg/dL (ref 0.3–1.2)
Total Protein: 7.6 g/dL (ref 6.5–8.1)

## 2019-04-08 LAB — LIPASE, BLOOD: Lipase: 29 U/L (ref 11–51)

## 2019-04-08 MED ORDER — ONDANSETRON 4 MG PO TBDP: 4.0000 mg | ORAL_TABLET | Freq: Three times a day (TID) | ORAL | 0 refills | Status: AC | PRN

## 2019-04-08 MED ORDER — HYDROCODONE-ACETAMINOPHEN 5-325 MG PO TABS: 1.0000 | ORAL_TABLET | Freq: Four times a day (QID) | ORAL | 0 refills | Status: DC | PRN

## 2019-04-08 MED ORDER — MORPHINE SULFATE (PF) 4 MG/ML IV SOLN
4.0000 mg | Freq: Once | INTRAVENOUS | Status: AC
  Administered 2019-04-08: 4 mg via INTRAVENOUS
  Filled 2019-04-08: qty 1

## 2019-04-08 MED ORDER — ONDANSETRON HCL 4 MG/2ML IJ SOLN
4.0000 mg | INTRAMUSCULAR | Status: AC
  Administered 2019-04-08: 06:00:00 4 mg via INTRAVENOUS
  Filled 2019-04-08: qty 2

## 2019-04-08 MED ORDER — TAMSULOSIN HCL 0.4 MG PO CAPS: 0.4000 mg | ORAL_CAPSULE | Freq: Every day | ORAL | 0 refills | Status: AC

## 2019-04-08 NOTE — ED Notes (Signed)
AAOx3.  Skin warm and dry.  nad 

## 2019-04-08 NOTE — ED Triage Notes (Signed)
Patient c/o right flank pain. Patient denies urinary changes and hx of kidney problems.

## 2019-04-08 NOTE — ED Provider Notes (Signed)
[facility] Emergency Department Provider Note   ____________________________________________    I have reviewed the triage vital signs and the nursing notes.   HISTORY  Chief Complaint Flank Pain     HPI Donald Savage is a 73 y.o. male who presents with complaints of right-sided flank pain.  Patient reports the pain started relatively abruptly around 2 AM and did radiate to his groin.  He states that it became severe and sharp in nature and made him nauseated.  He denies fevers or chills.  No dysuria.  Is not diabetic.  Never had this before.  No history of kidney stones.  No injury to the area.  Past Medical History:  Diagnosis Date  . Hyperlipidemia   . Macrocytic anemia   . Pancytopenia (HCC)   . Pernicious anemia    POSITIVE parietal cell antibody    Patient Active Problem List   Diagnosis Date Noted  . BPH (benign prostatic hyperplasia) 01/29/2017  . Enlarged testicle 01/29/2017  . Advanced care planning/counseling discussion 01/29/2017  . Hyperlipidemia   . Pernicious anemia     History reviewed. No pertinent surgical history.  Prior to Admission medications   Medication Sig Start Date End Date Taking? Authorizing Provider  cholecalciferol (VITAMIN D3) 25 MCG (1000 UT) tablet Take 1,000 Units by mouth daily.   Yes [provider]  vitamin B-12 (CYANOCOBALAMIN) 1000 MCG tablet Take 1,000 mcg by mouth daily.   Yes [provider]  HYDROcodone-acetaminophen (NORCO/VICODIN) 5-325 MG tablet Take 1 tablet by mouth every 6 (six) hours as needed for moderate pain or severe pain. 04/08/19   Jene EveryKinner, Jeanett Antonopoulos, MD  ondansetron (ZOFRAN ODT) 4 MG disintegrating tablet Take 1 tablet (4 mg total) by mouth every 8 (eight) hours as needed. 04/08/19   Jene EveryKinner, Mckynleigh Mussell, MD  tamsulosin (FLOMAX) 0.4 MG CAPS capsule Take 1 capsule (0.4 mg total) by mouth daily. 04/08/19   Jene EveryKinner, Harleen Fineberg, MD     Allergies Patient has no known allergies.   Family History  Problem Relation Age of Onset  . Heart disease Mother   . Cancer Sister        throat  . Diabetes Paternal Grandmother     Social History Social History   Tobacco Use  . Smoking status: Never Smoker  . Smokeless tobacco: Never Used  Substance Use Topics  . Alcohol use: No  . Drug use: No    Review of Systems  Constitutional: No fever/chills Eyes: No visual changes.  ENT: No sore throat. Cardiovascular: Denies chest pain. Respiratory: Denies shortness of breath. Gastrointestinal: As above Genitourinary: Negative for dysuria.  Has not noticed hematuria Musculoskeletal: As above Skin: Negative for rash. Neurological: Negative for headaches or weakness   ____________________________________________   PHYSICAL EXAM:  VITAL SIGNS: ED Triage Vitals  Enc Vitals Group     BP 04/08/19 0610 (!) 163/70     Pulse Rate 04/08/19 0610 (!) 122     Resp 04/08/19 0610 20     Temp 04/08/19 0610 97.6 F (36.4 C)     Temp Source 04/08/19 0610 Oral     SpO2 04/08/19 0610 100 %     Weight 04/08/19 0602 74.8 kg (165 lb)     Height 04/08/19 0602 1.778 m (5\' 10" )     Head Circumference --      Peak Flow --      Pain Score 04/08/19 0602 8     Pain Loc --  Pain Edu? --      Excl. in [place]? --     Constitutional: Alert and oriented.  Eyes: Conjunctivae are normal.   . Mouth/Throat: Mucous membranes are moist.    Cardiovascular: Normal rate, regular rhythm. Grossly normal heart sounds.  Good peripheral circulation. Respiratory: Normal respiratory effort.  No retractions. Lungs CTAB. Gastrointestinal: Soft and nontender. No distention.  No CVA tenderness.  Reassuring exam Genitourinary: deferred Musculoskeletal:   Warm and well perfused Neurologic:  Normal speech and language. No gross focal neurologic deficits are appreciated.  Skin:  Skin is warm, dry and intact. No rash noted. Psychiatric: Mood and affect are normal. Speech and behavior are normal.   ____________________________________________   LABS (all labs ordered are listed, but only abnormal results are displayed)  Labs Reviewed  URINALYSIS, COMPLETE (UACMP) WITH MICROSCOPIC - Abnormal; Notable for the following components:      Result Value   Color, Urine YELLOW (*)    APPearance CLEAR (*)    Hgb urine dipstick MODERATE (*)    RBC / HPF >50 (*)    All other components within normal limits  COMPREHENSIVE METABOLIC PANEL - Abnormal; Notable for the following components:   Glucose, Bld 180 (*)    Creatinine, Ser 1.48 (*)    GFR calc non Af Amer 46 (*)    GFR calc Af Amer 53 (*)    All other components within normal limits  CBC  LIPASE, BLOOD   ____________________________________________  EKG  None ____________________________________________  RADIOLOGY  CT scan pending ____________________________________________   PROCEDURES  Procedure(s) performed: No  Procedures   Critical Care performed: No ____________________________________________   INITIAL IMPRESSION / ASSESSMENT AND PLAN / ED COURSE  Pertinent labs & imaging results that were available during my care of the patient were reviewed by me and considered in my medical decision making (see chart for details).  Patient presents with abrupt onset of right flank pain radiating to the groin, quite suspicious for ureterolithiasis.  Treated with IV morphine, IV Zofran with significant improvement in pain.  Lab work is overall unremarkable, pending urinalysis and CT scan.  We will continue to monitor ----------------------------------------- 8:59 AM on 04/08/2019 -----------------------------------------   Patient's pain remains improved, CT scan demonstrates 6 mm proximal ureter stone.  Will discuss with Dr. Erlene Quan of urology to arrange close outpatient follow-up and possible lithotripsy    ____________________________________________   FINAL CLINICAL IMPRESSION(S) / ED DIAGNOSES  Final  diagnoses:  Ureterolithiasis        Note:  This document was prepared using Dragon voice recognition software and may include unintentional dictation errors.   Lavonia Drafts, MD 04/08/19 [phone]

## 2019-04-08 NOTE — Telephone Encounter (Signed)
Called by the emergency room about this patient.  He has a 6 mm proximal ureteral stone but his pain is able to be well controlled.  Requesting follow-up early next week, may be a candidate for ESWL next week.  Hollice Espy, MD

## 2019-04-12 ENCOUNTER — Ambulatory Visit
Admission: RE | Admit: 2019-04-12 | Discharge: 2019-04-12 | Disposition: A | Discharge status: Home / Self Care | Payer: Managed Care, Other (non HMO) | Attending: Urology | Admitting: Urology

## 2019-04-12 ENCOUNTER — Ambulatory Visit
Admission: RE | Admit: 2019-04-12 | Discharge: 2019-04-12 | Disposition: A | Discharge status: Home / Self Care | Payer: Managed Care, Other (non HMO) | Source: Ambulatory Visit | Attending: Urology | Admitting: Urology

## 2019-04-12 ENCOUNTER — Other Ambulatory Visit: Payer: Self-pay

## 2019-04-12 ENCOUNTER — Encounter: Payer: Self-pay | Admitting: Urology

## 2019-04-12 ENCOUNTER — Ambulatory Visit (INDEPENDENT_AMBULATORY_CARE_PROVIDER_SITE_OTHER): Payer: Managed Care, Other (non HMO) | Admitting: Urology

## 2019-04-12 ENCOUNTER — Telehealth: Payer: Self-pay | Admitting: Urology

## 2019-04-12 VITALS — BP 159/94 | HR 77 | Ht 70.0 in | Wt 165.0 lb

## 2019-04-12 DIAGNOSIS — N200 Calculus of kidney: Secondary | ICD-10-CM

## 2019-04-12 DIAGNOSIS — N179 Acute kidney failure, unspecified: Secondary | ICD-10-CM

## 2019-04-12 DIAGNOSIS — N201 Calculus of ureter: Secondary | ICD-10-CM

## 2019-04-12 LAB — URINALYSIS, COMPLETE
Bilirubin, UA: NEGATIVE
Glucose, UA: NEGATIVE
Ketones, UA: NEGATIVE
Leukocytes,UA: NEGATIVE
Nitrite, UA: NEGATIVE
Protein,UA: NEGATIVE
Specific Gravity, UA: 1.015 (ref 1.005–1.030)
Urobilinogen, Ur: 0.2 mg/dL (ref 0.2–1.0)
pH, UA: 6.5 (ref 5.0–7.5)

## 2019-04-12 LAB — MICROSCOPIC EXAMINATION
Bacteria, UA: NONE SEEN
Epithelial Cells (non renal): NONE SEEN /hpf (ref 0–10)
WBC, UA: NONE SEEN /hpf (ref 0–5)

## 2019-04-12 NOTE — Progress Notes (Signed)
04/12/2019 9:16 AM   Casper HarrisonJulian C Eddington 12/13/1945 [id number]  Referring provider: Fayrene HelperBoswell, Chelsa H, NP [address] [place] [id number]  Chief Complaint  Patient presents with  . Nephrolithiasis    HPI: 73 year old male who presents today for further evaluation of a 6 mm right proximal ureteral calculus.  He was seen and evaluated in the emergency room on 04/08/2019 with acute onset severe right flank pain.  CT scan showed the stone as above.  He reports he started having pain on the same day as his emergency visit.  The pain became more severe with associated nausea.  He is never had a kidney stone before.  He had no associated urinary symptoms including no frequency, urgency, dysuria or gross hematuria.  His labs are fairly unremarkable other than a mildly elevated creatinine to 1.48 from baseline of 1.2.  He last had pain yesterday.  This was located more in the lower abdomen radiating to his testicle.  He took 1 pain pill and his pain resolved.  He is had no further pain.  He has not seen the stone pass.  Patient was sent for a KUB today which shows no obvious ureteral calculus.  He is scheduled to have hernia surgery next week robotically.  He is little bit worried that the stone may impact his ability to have surgery.  PMH: Past Medical History:  Diagnosis Date  . Hyperlipidemia   . Macrocytic anemia   . Pancytopenia (HCC)   . Pernicious anemia    POSITIVE parietal cell antibody    Surgical History: No past surgical history on file.  Home Medications:  Allergies as of 04/12/2019   No Known Allergies     Medication List       Accurate as of April 12, 2019  9:16 AM. If you have any questions, ask your nurse or doctor.        cholecalciferol 25 MCG (1000 UT) tablet Commonly known as: VITAMIN D3 Take 1,000 Units by mouth daily.   HYDROcodone-acetaminophen 5-325 MG tablet Commonly known as: NORCO/VICODIN Take 1 tablet by mouth every 6 (six) hours as  needed for moderate pain or severe pain.   ondansetron 4 MG disintegrating tablet Commonly known as: Zofran ODT Take 1 tablet (4 mg total) by mouth every 8 (eight) hours as needed.   tamsulosin 0.4 MG Caps capsule Commonly known as: Flomax Take 1 capsule (0.4 mg total) by mouth daily.   vitamin B-12 1000 MCG tablet Commonly known as: CYANOCOBALAMIN Take 1,000 mcg by mouth daily.       Allergies: No Known Allergies  Family History: Family History  Problem Relation Age of Onset  . Heart disease Mother   . Cancer Sister        throat  . Diabetes Paternal Grandmother     Social History:  reports that he has never smoked. He has never used smokeless tobacco. He reports that he does not drink alcohol or use drugs.  ROS: UROLOGY Frequent Urination?: No Hard to postpone urination?: No Burning/pain with urination?: No Get up at night to urinate?: No Leakage of urine?: No Urine stream starts and stops?: No Trouble starting stream?: No Do you have to strain to urinate?: No Blood in urine?: No Urinary tract infection?: No Sexually transmitted disease?: No Injury to kidneys or bladder?: No Painful intercourse?: No Weak stream?: No Erection problems?: No Penile pain?: No  Gastrointestinal Nausea?: No Vomiting?: No Indigestion/heartburn?: No Diarrhea?: No Constipation?: No  Constitutional Fever: No  Night sweats?: No Weight loss?: No Fatigue?: No  Skin Skin rash/lesions?: No Itching?: No  Eyes Blurred vision?: No Double vision?: No  Ears/Nose/Throat Sore throat?: No Sinus problems?: No  Hematologic/Lymphatic Swollen glands?: No Easy bruising?: No  Cardiovascular Leg swelling?: No Chest pain?: No  Respiratory Cough?: No Shortness of breath?: No  Endocrine Excessive thirst?: No  Musculoskeletal Back pain?: No Joint pain?: No  Neurological Headaches?: No Dizziness?: No  Psychologic Depression?: No Anxiety?: No  Physical Exam: BP (!)  159/94   Pulse 77   Ht 5\' 10"  (1.778 m)   Wt 165 lb (74.8 kg)   BMI 23.68 kg/m   Constitutional:  Alert and oriented, No acute distress. HEENT: [place] AT, moist mucus membranes.  Trachea midline, no masses. Cardiovascular: No clubbing, cyanosis, or edema. Respiratory: Normal respiratory effort, no increased work of breathing. GI: Abdomen is soft, nontender, nondistended, no abdominal masses Skin: No rashes, bruises or suspicious lesions. Neurologic: Grossly intact, no focal deficits, moving all 4 extremities. Psychiatric: Normal mood and affect.  Laboratory Data: Lab Results  Component Value Date   WBC 8.3 04/08/2019   HGB 14.0 04/08/2019   HCT 42.7 04/08/2019   MCV 88.8 04/08/2019   PLT 179 04/08/2019    Lab Results  Component Value Date   CREATININE 1.48 (H) 04/08/2019    Urinalysis Results for orders placed or performed in visit on 04/12/19  CULTURE, URINE COMPREHENSIVE   Specimen: Urine   UR  Result Value Ref Range   Urine Culture, Comprehensive Preliminary report    Organism ID, Bacteria Comment   Microscopic Examination   URINE  Result Value Ref Range   WBC, UA None seen 0 - 5 /hpf   RBC 3-10 (A) 0 - 2 /hpf   Epithelial Cells (non renal) None seen 0 - 10 /hpf   Bacteria, UA None seen None seen/Few  Urinalysis, Complete  Result Value Ref Range   Specific Gravity, UA 1.015 1.005 - 1.030   pH, UA 6.5 5.0 - 7.5   Color, UA Yellow Yellow   Appearance Ur Clear Clear   Leukocytes,UA Negative Negative   Protein,UA Negative Negative/Trace   Glucose, UA Negative Negative   Ketones, UA Negative Negative   RBC, UA 1+ (A) Negative   Bilirubin, UA Negative Negative   Urobilinogen, Ur 0.2 0.2 - 1.0 mg/dL   Nitrite, UA Negative Negative   Microscopic Examination See below:     Pertinent Imaging: Results for orders placed during the hospital encounter of 04/08/19  CT Renal Stone Study   Narrative CLINICAL DATA:  Right flank pain  EXAM: CT ABDOMEN AND PELVIS  WITHOUT CONTRAST  TECHNIQUE: Multidetector CT imaging of the abdomen and pelvis was performed following the standard protocol without IV contrast.  COMPARISON:  March 13, 2008  FINDINGS: Lower chest: Minimal atelectasis of posterior lung bases are noted.  Hepatobiliary: A few small low-density lesions identified in the liver unchanged compared prior exam probably due to cysts. The gallbladder is normal. The biliary tree is normal.  Pancreas: Unremarkable. No pancreatic ductal dilatation or surrounding inflammatory changes.  Spleen: Normal in size without focal abnormality.  Adrenals/Urinary Tract: The bilateral adrenal glands are normal. There is right hydronephrosis and right perinephric stranding due to obstruction by a 6 mm stone in the proximal right ureter. There is a 2 mm nonobstructing stone in the left kidney. There is no left hydronephrosis. The bladder is normal.  Stomach/Bowel: Stomach is within normal limits. The appendix is normal. No evidence  of bowel wall thickening, distention, or inflammatory changes.  Vascular/Lymphatic: Aortic atherosclerosis. No enlarged abdominal or pelvic lymph nodes.  Reproductive: Prostate calcifications are noted.  Other: Right inguinal herniation of mesenteric fat is identified.  Musculoskeletal: Degenerative joint changes of the spine are noted.  IMPRESSION: Right hydroureteronephrosis due to obstruction by a 6 mm stone in the proximal right ureter.   Electronically Signed   By: Abelardo Diesel M.D.   On: 04/08/2019 07:30    CLINICAL DATA:  Acute right flank pain.  Nephrolithiasis.  EXAM: ABDOMEN - 1 VIEW  COMPARISON:  CT scan of April 08, 2019. Radiographs of June 11, 2016.  FINDINGS: The bowel gas pattern is normal. Small nonobstructive left renal calculus is noted. Right ureteral calculus seen on prior CT scan is not visualized currently.  IMPRESSION: Left nephrolithiasis.  No evidence of bowel  obstruction or ileus.   Electronically Signed   By: Marijo Conception M.D.   On: 04/12/2019 14:29  CT scan was personally reviewed today.  KUB was ordered and directly reviewed by me.  Obstructing ureteral calculus is not seen on KUB.  Assessment & Plan:    1. Right ureteral stone 6 mm left proximal ureteral stone on CT scan Given that his pain is moved radiating down more towards her testicle, it is likely that the stone is either distal or is passed No further pain since yesterday which is reassuring, microscopic hematuria is also improving KUB today was ordered by me, shows no evidence of ureteral calculus and would anticipate that it would be seen on KUB based on visualization on scout imaging  We discussed various treatment options for ureteral stone but given that it is probably the stone is passed or will pass, surveillance would be a better option at this point time.  In the interim, warning symptoms were reviewed.  I like him to strain his urine consistently..  We will follow-up in 2 weeks with renal ultrasound unless he sees the stone pass spontaneously to ensure that he has indeed passed a stone.  He is agreeable this plan.  Will call his renal ultrasound results. - Urinalysis, Complete - CULTURE, URINE COMPREHENSIVE - DG Abd 1 View; Future - Abdomen 1 view (KUB); Future - Microscopic Examination - US RENAL; Future  2. AKI (acute kidney injury) ([place]) Secondary to unilateral obstruction/prerenal, should resolve with stone passage  3. Nephrolithiasis Few small nonobstructing stones, given lack of stone history would recommend follow these conservatively He is agreeable this plan  Will call with renal ultrasound results  Hollice Espy, MD  [facility] [address], [place], [place] [address] [phone]

## 2019-04-12 NOTE — Telephone Encounter (Signed)
Pt LMOM and wants his XRAY results. Please advise.

## 2019-04-12 NOTE — Telephone Encounter (Signed)
Please review and advise.

## 2019-04-14 ENCOUNTER — Ambulatory Visit: Payer: Self-pay | Admitting: Surgery

## 2019-04-14 LAB — CULTURE, URINE COMPREHENSIVE

## 2019-04-14 NOTE — H&P (View-Only) (Signed)
Subjective:   CC: Non-recurrent unilateral inguinal hernia without obstruction or gangrene [K40.90]  HPI:  Donald Savage is a 73 y.o. male who was referred by Lurlean Leyden, * for evaluation of above. Symptoms were first noted 2 weeks ago. Pain is achy, intermittent and discomfort, confined to the right inguinal region, without radiation.  Associated with nothing specific, exacerbated by nothing specific  Lump is not reducible. Patient has no symptoms of  difficulty urinating.    Past Medical History:  has a past medical history of B12 deficiency.  Past Surgical History: No past surgical history on file.  Family History: family history includes Cancer in his sister; Colon cancer in his father; Heart failure in his mother.  Social History:  reports that he has never smoked. He has never used smokeless tobacco. He reports that he does not drink alcohol or use drugs.  Current Medications: has a current medication list which includes the following prescription(s): cyanocobalamin.  Allergies:     Allergies as of 04/06/2019  . (No Known Allergies)    ROS:  A 15 point review of systems was performed and pertinent positives and negatives noted in HPI   Objective:   BP 138/70   Pulse 79   Ht 177.8 cm (5\' 10" )   Wt 73.8 kg (162 lb 9.6 oz)   BMI 23.33 kg/m   Constitutional :  alert, appears stated age, cooperative and no distress  Lymphatics/Throat:  no asymmetry, masses, or scars  Respiratory:  clear to auscultation bilaterally  Cardiovascular:  regular rate and rhythm  Gastrointestinal: soft, non-tender; bowel sounds normal; no masses,  no organomegaly. inguinal hernia noted.  small and reducible RIGHT  Musculoskeletal: Steady gait and movement  Skin: Cool and moist  Psychiatric: Normal affect, non-agitated, not confused       LABS:  n/a   RADS: n/a Assessment:       Non-recurrent unilateral inguinal hernia without obstruction or gangrene  [K40.90]  Plan:   1. Non-recurrent unilateral inguinal hernia without obstruction or gangrene [K40.90]   Discussed the risk of surgery including recurrence, which can be up to 50% in the case of incisional or complex hernias, possible use of prosthetic materials (mesh) and the increased risk of mesh infxn if used, bleeding, chronic pain, post-op infxn, post-op SBO or ileus, and possible re-operation to address said risks. The risks of general anesthetic, if used, includes MI, CVA, sudden death or even reaction to anesthetic medications also discussed. Alternatives include continued observation.  Benefits include possible symptom relief, prevention of incarceration, strangulation, enlargement in size over time, and the risk of emergency surgery in the face of strangulation.   Typical post-op recovery time of 3-5 days with 4-6 weeks of activity restrictions were also discussed.  ED return precautions given for sudden increase in pain, size of hernia with accompanying fever, nausea, and/or vomiting.  The patient verbalized understanding and all questions were answered to the patient's satisfaction.   2. Patient has elected to proceed with surgical treatment. Procedure will be scheduled.  Written consent was obtained. Robotic assisted.      Electronically signed by Benjamine Sprague, DO on 04/06/2019 4:11 PM

## 2019-04-14 NOTE — H&P (Signed)
Subjective:   CC: Non-recurrent unilateral inguinal hernia without obstruction or gangrene [K40.90]  HPI:  Donald Savage is a 73 y.o. male who was referred by Kimberly Glenn Lykins, * for evaluation of above. Symptoms were first noted 2 weeks ago. Pain is achy, intermittent and discomfort, confined to the right inguinal region, without radiation.  Associated with nothing specific, exacerbated by nothing specific  Lump is not reducible. Patient has no symptoms of  difficulty urinating.    Past Medical History:  has a past medical history of B12 deficiency.  Past Surgical History: No past surgical history on file.  Family History: family history includes Cancer in his sister; Colon cancer in his father; Heart failure in his mother.  Social History:  reports that he has never smoked. He has never used smokeless tobacco. He reports that he does not drink alcohol or use drugs.  Current Medications: has a current medication list which includes the following prescription(s): cyanocobalamin.  Allergies:     Allergies as of 04/06/2019  . (No Known Allergies)    ROS:  A 15 point review of systems was performed and pertinent positives and negatives noted in HPI   Objective:   BP 138/70   Pulse 79   Ht 177.8 cm (5' 10")   Wt 73.8 kg (162 lb 9.6 oz)   BMI 23.33 kg/m   Constitutional :  alert, appears stated age, cooperative and no distress  Lymphatics/Throat:  no asymmetry, masses, or scars  Respiratory:  clear to auscultation bilaterally  Cardiovascular:  regular rate and rhythm  Gastrointestinal: soft, non-tender; bowel sounds normal; no masses,  no organomegaly. inguinal hernia noted.  small and reducible RIGHT  Musculoskeletal: Steady gait and movement  Skin: Cool and moist  Psychiatric: Normal affect, non-agitated, not confused       LABS:  n/a   RADS: n/a Assessment:       Non-recurrent unilateral inguinal hernia without obstruction or gangrene  [K40.90]  Plan:   1. Non-recurrent unilateral inguinal hernia without obstruction or gangrene [K40.90]   Discussed the risk of surgery including recurrence, which can be up to 50% in the case of incisional or complex hernias, possible use of prosthetic materials (mesh) and the increased risk of mesh infxn if used, bleeding, chronic pain, post-op infxn, post-op SBO or ileus, and possible re-operation to address said risks. The risks of general anesthetic, if used, includes MI, CVA, sudden death or even reaction to anesthetic medications also discussed. Alternatives include continued observation.  Benefits include possible symptom relief, prevention of incarceration, strangulation, enlargement in size over time, and the risk of emergency surgery in the face of strangulation.   Typical post-op recovery time of 3-5 days with 4-6 weeks of activity restrictions were also discussed.  ED return precautions given for sudden increase in pain, size of hernia with accompanying fever, nausea, and/or vomiting.  The patient verbalized understanding and all questions were answered to the patient's satisfaction.   2. Patient has elected to proceed with surgical treatment. Procedure will be scheduled.  Written consent was obtained. Robotic assisted.      Electronically signed by Adaliah Hiegel, DO on 04/06/2019 4:11 PM     

## 2019-04-18 ENCOUNTER — Other Ambulatory Visit: Payer: Self-pay

## 2019-04-18 ENCOUNTER — Encounter
Admission: RE | Admit: 2019-04-18 | Discharge: 2019-04-18 | Disposition: A | Discharge status: Home / Self Care | Payer: Managed Care, Other (non HMO) | Source: Ambulatory Visit | Attending: Surgery | Admitting: Surgery

## 2019-04-18 DIAGNOSIS — Z01818 Encounter for other preprocedural examination: Secondary | ICD-10-CM | POA: Insufficient documentation

## 2019-04-18 DIAGNOSIS — Z1159 Encounter for screening for other viral diseases: Secondary | ICD-10-CM | POA: Insufficient documentation

## 2019-04-18 DIAGNOSIS — R001 Bradycardia, unspecified: Secondary | ICD-10-CM | POA: Insufficient documentation

## 2019-04-18 NOTE — Patient Instructions (Signed)
Your procedure is scheduled on: Friday 04/22/19.  Report to DAY SURGERY DEPARTMENT LOCATED ON 2ND FLOOR MEDICAL MALL ENTRANCE. To find out your arrival time please call [phone] between 1PM - 3PM on Thursday 04/21/19.  Remember: Instructions that are not followed completely may result in serious medical risk, up to and including death, or upon the discretion of your surgeon and anesthesiologist your surgery may need to be rescheduled.      _X__ 1. Do not eat food after midnight the night before your procedure.                 No gum chewing or hard candies. You may drink clear liquids up to 2 hours                 before you are scheduled to arrive for your surgery- DO NOT drink clear                 liquids within 2 hours of the start of your surgery.                 Clear Liquids include:  water, apple juice without pulp, clear carbohydrate                 drink such as Clearfast or Gatorade, Black Coffee or Tea (Do not add                 Milk or creamer to coffee or tea).  __X__2.  On the morning of surgery brush your teeth with toothpaste and water, you may rinse your mouth with mouthwash if you wish.  Do not swallow any toothpaste or mouthwash.     __X__3.  Notify your doctor if there is any change in your medical condition      (cold, fever, infections).     Do not wear jewelry, make-up, hairpins, clips or nail polish. Do not wear lotions, powders, or perfumes.  Do not shave 48 hours prior to surgery. Men may shave face and neck. Do not bring valuables to the hospital.    [facility] is not responsible for any belongings or valuables.  Contacts, dentures/partials or body piercings may not be worn into surgery. Bring a case for your contacts, glasses or hearing aids, a denture cup will be supplied.  Patients discharged the day of surgery will not be allowed to drive home.   Please read over the following fact sheets that you were given:   MRSA Information  __X__ Take  these medicines the morning of surgery with A SIP OF WATER:     1. NONE  2.   3.   4.  5.  6.     __X__ Use CHG Soap as directed   __X__ Stop Anti-inflammatories 7 days before surgery such as Advil, Ibuprofen, Motrin, BC or Goodies Powder, Naprosyn, Naproxen, Aleve, Aspirin, Meloxicam. May take Tylenol if needed for pain or discomfort.    __X__ Stop all herbal supplements.

## 2019-04-19 ENCOUNTER — Inpatient Hospital Stay: Admission: RE | Admit: 2019-04-19 | Payer: Medicare Other | Source: Ambulatory Visit

## 2019-04-19 LAB — SARS CORONAVIRUS 2 (TAT 6-24 HRS): SARS Coronavirus 2: NEGATIVE

## 2019-04-21 MED ORDER — CEFAZOLIN SODIUM-DEXTROSE 2-4 GM/100ML-% IV SOLN
2.0000 g | INTRAVENOUS | Status: AC
  Administered 2019-04-22: 2 g via INTRAVENOUS

## 2019-04-22 ENCOUNTER — Encounter
Admission: RE | Disposition: A | Discharge status: Home / Self Care | Payer: Self-pay | Source: Home / Self Care | Attending: Surgery

## 2019-04-22 ENCOUNTER — Ambulatory Visit
Admission: RE | Admit: 2019-04-22 | Discharge: 2019-04-22 | Disposition: A | Discharge status: Home / Self Care | Payer: Managed Care, Other (non HMO) | Attending: Surgery | Admitting: Surgery

## 2019-04-22 ENCOUNTER — Ambulatory Visit: Payer: Managed Care, Other (non HMO) | Admitting: Certified Registered Nurse Anesthetist

## 2019-04-22 ENCOUNTER — Other Ambulatory Visit: Payer: Self-pay

## 2019-04-22 ENCOUNTER — Encounter: Payer: Self-pay | Admitting: *Deleted

## 2019-04-22 DIAGNOSIS — K4090 Unilateral inguinal hernia, without obstruction or gangrene, not specified as recurrent: Secondary | ICD-10-CM | POA: Insufficient documentation

## 2019-04-22 DIAGNOSIS — N189 Chronic kidney disease, unspecified: Secondary | ICD-10-CM | POA: Insufficient documentation

## 2019-04-22 HISTORY — PX: XI ROBOTIC ASSISTED INGUINAL HERNIA REPAIR WITH MESH: SHX6706

## 2019-04-22 SURGERY — REPAIR, HERNIA, INGUINAL, ROBOT-ASSISTED, LAPAROSCOPIC, USING MESH
Anesthesia: General | Laterality: Right

## 2019-04-22 MED ORDER — PROPOFOL 10 MG/ML IV BOLUS
INTRAVENOUS | Status: AC
  Filled 2019-04-22: qty 20

## 2019-04-22 MED ORDER — DOCUSATE SODIUM 100 MG PO CAPS: 100.0000 mg | ORAL_CAPSULE | Freq: Two times a day (BID) | ORAL | 0 refills | Status: AC | PRN

## 2019-04-22 MED ORDER — ONDANSETRON HCL 4 MG/2ML IJ SOLN
INTRAMUSCULAR | Status: DC | PRN
  Administered 2019-04-22: 4 mg via INTRAVENOUS

## 2019-04-22 MED ORDER — OXYCODONE HCL 5 MG/5ML PO SOLN: 5.0000 mg | Freq: Once | ORAL | Status: DC | PRN

## 2019-04-22 MED ORDER — GABAPENTIN 300 MG PO CAPS
300.0000 mg | ORAL_CAPSULE | ORAL | Status: AC
  Administered 2019-04-22: 10:00:00 300 mg via ORAL

## 2019-04-22 MED ORDER — FENTANYL CITRATE (PF) 250 MCG/5ML IJ SOLN
INTRAMUSCULAR | Status: AC
  Filled 2019-04-22: qty 5

## 2019-04-22 MED ORDER — BUPIVACAINE LIPOSOME 1.3 % IJ SUSP
INTRAMUSCULAR | Status: DC | PRN
  Administered 2019-04-22: 20 mL

## 2019-04-22 MED ORDER — CEFAZOLIN SODIUM-DEXTROSE 2-4 GM/100ML-% IV SOLN
INTRAVENOUS | Status: AC
  Filled 2019-04-22: qty 100

## 2019-04-22 MED ORDER — SUGAMMADEX SODIUM 200 MG/2ML IV SOLN
INTRAVENOUS | Status: AC
  Filled 2019-04-22: qty 2

## 2019-04-22 MED ORDER — OXYCODONE HCL 5 MG PO TABS: 5.0000 mg | ORAL_TABLET | Freq: Once | ORAL | Status: DC | PRN

## 2019-04-22 MED ORDER — GLYCOPYRROLATE 0.2 MG/ML IJ SOLN
INTRAMUSCULAR | Status: DC | PRN
  Administered 2019-04-22: 0.2 mg via INTRAVENOUS

## 2019-04-22 MED ORDER — EPHEDRINE SULFATE 50 MG/ML IJ SOLN
INTRAMUSCULAR | Status: DC | PRN
  Administered 2019-04-22: 5 mg via INTRAVENOUS
  Administered 2019-04-22: 10 mg via INTRAVENOUS

## 2019-04-22 MED ORDER — LACTATED RINGERS IV SOLN
INTRAVENOUS | Status: DC | PRN
  Administered 2019-04-22: 12:00:00 via INTRAVENOUS

## 2019-04-22 MED ORDER — BUPIVACAINE-EPINEPHRINE 0.5% -1:200000 IJ SOLN
INTRAMUSCULAR | Status: DC | PRN
  Administered 2019-04-22: 15 mL

## 2019-04-22 MED ORDER — FAMOTIDINE 20 MG PO TABS
ORAL_TABLET | ORAL | Status: AC
  Filled 2019-04-22: qty 1

## 2019-04-22 MED ORDER — LIDOCAINE HCL (CARDIAC) PF 100 MG/5ML IV SOSY
PREFILLED_SYRINGE | INTRAVENOUS | Status: DC | PRN
  Administered 2019-04-22: 80 mg via INTRAVENOUS

## 2019-04-22 MED ORDER — LACTATED RINGERS IV SOLN
INTRAVENOUS | Status: DC
  Administered 2019-04-22: 10:00:00 via INTRAVENOUS

## 2019-04-22 MED ORDER — FENTANYL CITRATE (PF) 100 MCG/2ML IJ SOLN
25.0000 ug | INTRAMUSCULAR | Status: DC | PRN
  Administered 2019-04-22 (×2): 25 ug via INTRAVENOUS

## 2019-04-22 MED ORDER — FAMOTIDINE 20 MG PO TABS: 20.0000 mg | ORAL_TABLET | Freq: Once | ORAL | Status: DC

## 2019-04-22 MED ORDER — CHLORHEXIDINE GLUCONATE CLOTH 2 % EX PADS: 6.0000 | MEDICATED_PAD | Freq: Once | CUTANEOUS | Status: DC

## 2019-04-22 MED ORDER — EPHEDRINE SULFATE 50 MG/ML IJ SOLN
INTRAMUSCULAR | Status: AC
  Filled 2019-04-22: qty 1

## 2019-04-22 MED ORDER — BUPIVACAINE LIPOSOME 1.3 % IJ SUSP
INTRAMUSCULAR | Status: AC
  Filled 2019-04-22: qty 20

## 2019-04-22 MED ORDER — ROCURONIUM BROMIDE 50 MG/5ML IV SOLN
INTRAVENOUS | Status: AC
  Filled 2019-04-22: qty 1

## 2019-04-22 MED ORDER — GABAPENTIN 300 MG PO CAPS
ORAL_CAPSULE | ORAL | Status: AC
  Administered 2019-04-22: 300 mg via ORAL
  Filled 2019-04-22: qty 1

## 2019-04-22 MED ORDER — CELECOXIB 200 MG PO CAPS
ORAL_CAPSULE | ORAL | Status: AC
  Administered 2019-04-22: 200 mg via ORAL
  Filled 2019-04-22: qty 1

## 2019-04-22 MED ORDER — ACETAMINOPHEN 325 MG PO TABS: 650.0000 mg | ORAL_TABLET | Freq: Three times a day (TID) | ORAL | 0 refills | Status: AC | PRN

## 2019-04-22 MED ORDER — GLYCOPYRROLATE 0.2 MG/ML IJ SOLN
INTRAMUSCULAR | Status: AC
  Filled 2019-04-22: qty 1

## 2019-04-22 MED ORDER — IBUPROFEN 400 MG PO TABS: 400.0000 mg | ORAL_TABLET | Freq: Three times a day (TID) | ORAL | 0 refills | Status: AC | PRN

## 2019-04-22 MED ORDER — DEXAMETHASONE SODIUM PHOSPHATE 10 MG/ML IJ SOLN
INTRAMUSCULAR | Status: DC | PRN
  Administered 2019-04-22: 10 mg via INTRAVENOUS

## 2019-04-22 MED ORDER — FENTANYL CITRATE (PF) 100 MCG/2ML IJ SOLN
INTRAMUSCULAR | Status: DC | PRN
  Administered 2019-04-22 (×3): 50 ug via INTRAVENOUS

## 2019-04-22 MED ORDER — ROCURONIUM BROMIDE 100 MG/10ML IV SOLN
INTRAVENOUS | Status: DC | PRN
  Administered 2019-04-22: 50 mg via INTRAVENOUS
  Administered 2019-04-22: 25 mg via INTRAVENOUS

## 2019-04-22 MED ORDER — BUPIVACAINE-EPINEPHRINE (PF) 0.5% -1:200000 IJ SOLN
INTRAMUSCULAR | Status: AC
  Filled 2019-04-22: qty 30

## 2019-04-22 MED ORDER — LIDOCAINE HCL (PF) 2 % IJ SOLN
INTRAMUSCULAR | Status: AC
  Filled 2019-04-22: qty 10

## 2019-04-22 MED ORDER — DEXAMETHASONE SODIUM PHOSPHATE 10 MG/ML IJ SOLN
INTRAMUSCULAR | Status: AC
  Filled 2019-04-22: qty 1

## 2019-04-22 MED ORDER — FENTANYL CITRATE (PF) 100 MCG/2ML IJ SOLN
INTRAMUSCULAR | Status: AC
  Administered 2019-04-22: 15:00:00 25 ug via INTRAVENOUS
  Filled 2019-04-22: qty 2

## 2019-04-22 MED ORDER — CELECOXIB 200 MG PO CAPS
200.0000 mg | ORAL_CAPSULE | ORAL | Status: AC
  Administered 2019-04-22: 10:00:00 200 mg via ORAL

## 2019-04-22 MED ORDER — SUGAMMADEX SODIUM 200 MG/2ML IV SOLN
INTRAVENOUS | Status: DC | PRN
  Administered 2019-04-22: 200 mg via INTRAVENOUS

## 2019-04-22 MED ORDER — ONDANSETRON HCL 4 MG/2ML IJ SOLN
INTRAMUSCULAR | Status: AC
  Filled 2019-04-22: qty 2

## 2019-04-22 MED ORDER — ACETAMINOPHEN 500 MG PO TABS
1000.0000 mg | ORAL_TABLET | ORAL | Status: AC
  Administered 2019-04-22: 10:00:00 1000 mg via ORAL

## 2019-04-22 MED ORDER — LIDOCAINE HCL 4 % MT SOLN
OROMUCOSAL | Status: DC | PRN
  Administered 2019-04-22: 4 mL via TOPICAL

## 2019-04-22 MED ORDER — PROPOFOL 10 MG/ML IV BOLUS
INTRAVENOUS | Status: DC | PRN
  Administered 2019-04-22: 120 mg via INTRAVENOUS

## 2019-04-22 MED ORDER — ACETAMINOPHEN 500 MG PO TABS
ORAL_TABLET | ORAL | Status: AC
  Administered 2019-04-22: 10:00:00 1000 mg via ORAL
  Filled 2019-04-22: qty 2

## 2019-04-22 SURGICAL SUPPLY — 57 items
BLADE CLIPPER SURG (BLADE) ×2 IMPLANT
BLADE SURG SZ11 CARB STEEL (BLADE) ×3 IMPLANT
CANISTER SUCT 1200ML W/VALVE (MISCELLANEOUS) ×3 IMPLANT
CANNULA REDUC XI 12-8 STAPL (CANNULA) ×1
CANNULA REDUC XI 12-8MM STAPL (CANNULA) ×1
CANNULA REDUCER 12-8 DVNC XI (CANNULA) ×1 IMPLANT
CHLORAPREP W/TINT 26 (MISCELLANEOUS) ×3 IMPLANT
COVER TIP SHEARS 8 DVNC (MISCELLANEOUS) ×1 IMPLANT
COVER TIP SHEARS 8MM DA VINCI (MISCELLANEOUS) ×2
COVER WAND RF STERILE (DRAPES) ×3 IMPLANT
DEFOGGER SCOPE WARMER CLEARIFY (MISCELLANEOUS) ×3 IMPLANT
DERMABOND ADVANCED (GAUZE/BANDAGES/DRESSINGS) ×2
DERMABOND ADVANCED .7 DNX12 (GAUZE/BANDAGES/DRESSINGS) ×1 IMPLANT
DRAPE 3/4 80X56 (DRAPES) ×3 IMPLANT
DRAPE ARM DVNC X/XI (DISPOSABLE) ×4 IMPLANT
DRAPE COLUMN DVNC XI (DISPOSABLE) ×1 IMPLANT
DRAPE DA VINCI XI ARM (DISPOSABLE) ×8
DRAPE DA VINCI XI COLUMN (DISPOSABLE) ×2
ELECT REM PT RETURN 9FT ADLT (ELECTROSURGICAL) ×3
ELECTRODE REM PT RTRN 9FT ADLT (ELECTROSURGICAL) ×1 IMPLANT
ETHIBOND 2 0 GREEN CT 2 30IN (SUTURE) ×6 IMPLANT
GLOVE BIOGEL PI IND STRL 7.0 (GLOVE) ×1 IMPLANT
GLOVE BIOGEL PI INDICATOR 7.0 (GLOVE) ×6
GLOVE SURG SYN 6.5 ES PF (GLOVE) ×9 IMPLANT
GLOVE SURG SYN 6.5 PF PI (GLOVE) ×1 IMPLANT
GOWN STRL REUS W/ TWL LRG LVL3 (GOWN DISPOSABLE) ×3 IMPLANT
GOWN STRL REUS W/TWL LRG LVL3 (GOWN DISPOSABLE) ×6
GRASPER SUT TROCAR 14GX15 (MISCELLANEOUS) ×3 IMPLANT
IRRIGATOR SUCT 8 DISP DVNC XI (IRRIGATION / IRRIGATOR) IMPLANT
IRRIGATOR SUCTION 8MM XI DISP (IRRIGATION / IRRIGATOR)
IV NS 1000ML (IV SOLUTION)
IV NS 1000ML BAXH (IV SOLUTION) IMPLANT
KIT PINK PAD W/HEAD ARE REST (MISCELLANEOUS) ×3
KIT PINK PAD W/HEAD ARM REST (MISCELLANEOUS) ×1 IMPLANT
LABEL OR SOLS (LABEL) ×3 IMPLANT
MESH 3DMAX 4X6 RT LRG (Mesh General) ×3 IMPLANT
NEEDLE HYPO 22GX1.5 SAFETY (NEEDLE) ×3 IMPLANT
NEEDLE VERESS 14GA 120MM (NEEDLE) ×3 IMPLANT
OBTURATOR OPTICAL STANDARD 8MM (TROCAR) ×2
OBTURATOR OPTICAL STND 8 DVNC (TROCAR) ×1
OBTURATOR OPTICALSTD 8 DVNC (TROCAR) ×1 IMPLANT
PACK LAP CHOLECYSTECTOMY (MISCELLANEOUS) ×3 IMPLANT
SEAL CANN UNIV 5-8 DVNC XI (MISCELLANEOUS) ×2 IMPLANT
SEAL XI 5MM-8MM UNIVERSAL (MISCELLANEOUS) ×4
SOLUTION ELECTROLUBE (MISCELLANEOUS) ×3 IMPLANT
STAPLER CANNULA SEAL DVNC XI (STAPLE) ×1 IMPLANT
STAPLER CANNULA SEAL XI (STAPLE) ×2
SUT DVC VLOC 3-0 CL 6 P-12 (SUTURE) ×6 IMPLANT
SUT MNCRL AB 4-0 PS2 18 (SUTURE) ×3 IMPLANT
SUT VIC AB 3-0 SH 27 (SUTURE) ×4
SUT VIC AB 3-0 SH 27X BRD (SUTURE) ×1 IMPLANT
SUT VICRYL 0 AB UR-6 (SUTURE) ×3 IMPLANT
SUT VLOC 90 2/L VL 12 GS22 (SUTURE) ×2 IMPLANT
SYR 30ML LL (SYRINGE) ×3 IMPLANT
TRAY FOLEY MTR SLVR 16FR STAT (SET/KITS/TRAYS/PACK) ×3 IMPLANT
TROCAR XCEL NON-BLD 5MMX100MML (ENDOMECHANICALS) ×3 IMPLANT
TUBING EVAC SMOKE HEATED PNEUM (TUBING) ×3 IMPLANT

## 2019-04-22 NOTE — Anesthesia Post-op Follow-up Note (Signed)
Anesthesia QCDR form completed.        

## 2019-04-22 NOTE — Transfer of Care (Signed)
Immediate Anesthesia Transfer of Care Note  Patient: Donald Savage  Procedure(s) Performed: XI ROBOTIC LAPAROSCOPIC ASSISTED RIGHT INGUINAL HERNIA REPAIR (Right )  Patient Location: PACU  Anesthesia Type:General  Level of Consciousness: drowsy  Airway & Oxygen Therapy: Patient Spontanous Breathing and Patient connected to face mask oxygen  Post-op Assessment: Report given to RN and Post -op Vital signs reviewed and stable  Post vital signs: Reviewed and stable  Last Vitals:  Vitals Value Taken Time  BP 145/66 04/22/19 1359  Temp 36.1 C 04/22/19 1359  Pulse 61 04/22/19 1402  Resp 14 04/22/19 1402  SpO2 100 % 04/22/19 1402  Vitals shown include unvalidated device data.  Last Pain:  Vitals:   04/22/19 1359  TempSrc:   PainSc: (P) Asleep         Complications: No apparent anesthesia complications

## 2019-04-22 NOTE — Op Note (Addendum)
Preoperative diagnosis: Right inguinal Hernia.  Postoperative diagnosis: Right indirect inguinal Hernia  Procedure: Robotic assisted laparoscopic Right inguinal hernia repair with mesh  Anesthesia: General  Surgeon: Dr. Tonna BoehringerSakai  Wound Classification: Clean  Specimen: none  Complications: None  Estimated Blood Loss: 10mL   Indications:  Patient is a 73 y.o. male developed a Right  inguinal hernia. Repair was indicated to avoid complications of incarceration, obstruction and pain, and a prosthetic mesh repair was elected.  Findings: 1. Vas Deferens and cord structures identified and preserved 2. Bard 3D max mesh used for repair 3. Adequate hemostasis achieved   Description of procedure: The patient was taken to the operating room. A time-out was completed verifying correct patient, procedure, site, positioning, and implant(s) and/or special equipment prior to beginning this procedure.  Bilateral groin was prepped and draped in the usual sterile fashion. An incision was marked 20 cm above the pubic tubercle, slightly above the umbilicus  Scrotum wrapped in Kerlix roll.  Foley catheter placed.  Incision was made at the previously marked site after injecting local anesthesia.  Dissection carried down to the fascia where at this point a Veress needle was inserted.  Saline drop test noted to be positive with gradual increase in pressure after initiation of gas insufflation.  15 mm of pressure was achieved prior to removing the Veress needle and then placing a 5 mm port via the Optiview technique through the previous needle insertion site.  Inspection of the area afterwards noted no injury to the surrounding organs during insertion of the needle and the Optiview port.  2 port sites were marked 8 cm to the lateral sides of the initial port, and a 8 mm robotic port was placed on the left side, 12 mm robotic port on the right side under direct supervision.  Local anesthesia  infused to the preplanned  incision site prior to insertion of the ports.  The [employer] Vinc[employer] Xi platform was then brought into the operative field and docked to the ports.  Examination of the abdominal cavity noted a Right  inguinal hernia.  A peritoneal flap was created approximately 8cm cephalad to the defect by using scissors with electrocautery.  Dissection was carried down towards the pubic tubercle, developing the myopectineal orifice view.  Laterally the flap was carried towards the ASIS.  Indirect hernia sac was noted, which carefully dissected away from the adjacent tissues to be fully reduced out of hernia cavity.  Any bleeding was controlled with combination of electrocautery and manual pressure.    After confirming adequate dissection and the peritoneal reflection completely down and away from the cord structures, a Bard 3DMax large mesh was placed within the anterior abdominal wall, secured in place using 2-0 Vicryl on an SH needle immediately above the pubic tubercle.  After noting proper placement of the mesh with the peritoneal reflection deep to it, the previously created peritoneal flap was secured back up to the anterior abdominal wall using running 3-0 V-Lock on a noncutting needle.  Both needles were then removed out of the abdominal cavity, Xi platform undocked from the ports and removed off of operative field, prior to removing the 12 mm port site and then closing the area with PMI device using 0 Vicryl, using the robotic camera.  Abdomen then desufflated and remaining ports removed.  The deep dermal layer at 12 mm port site closed with interrupted 3-0 Vicryl.  All the skin incisions were then closed with a subcuticular stitch of Monocryl 4-0. Dermabond was applied.  The testis was gently pulled down into its anatomic position in the scrotum.  Foley catheter removed. The patient tolerated the procedure well and was taken to the postanesthesia care unit in stable condition. Sponge and instrument count correct at end of  procedure.

## 2019-04-22 NOTE — Discharge Instructions (Addendum)
Hernia repair, Care After This sheet gives you information about how to care for yourself after your procedure. Your health care provider may also give you more specific instructions. If you have problems or questions, contact your health care provider. What can I expect after the procedure? After your procedure, it is common to have the following:  Pain in your abdomen, especially in the incision areas. You will be given medicine to control the pain.  Tiredness. This is a normal part of the recovery process. Your energy level will return to normal over the next several weeks.  Changes in your bowel movements, such as constipation or needing to go more often. Talk with your health care provider about how to manage this. Follow these instructions at home: Medicines   tylenol and advil as needed for discomfort.  Please alternate between the two every four hours as needed for pain.     Use narcotics, if prescribed, only when tylenol and motrin is not enough to control pain.   325-650mg every 8hrs to max of 3000mg/24hrs (including the 325mg in every norco dose) for the tylenol.     Advil up to 800mg per dose every 8hrs as needed for pain.    PLEASE RECORD NUMBER OF PILLS TAKEN UNTIL NEXT FOLLOW UP APPT.  THIS WILL HELP DETERMINE HOW READY YOU ARE TO BE RELEASED FROM ANY ACTIVITY RESTRICTIONS  Do not drive or use heavy machinery while taking prescription pain medicine.  Do not drink alcohol while taking prescription pain medicine.  Incision care     Follow instructions from your health care provider about how to take care of your incision areas. Make sure you: ? Keep your incisions clean and dry. ? Wash your hands with soap and water before and after applying medicine to the areas, and before and after changing your bandage (dressing). If soap and water are not available, use hand sanitizer. ? Change your dressing as told by your health care provider. ? Leave stitches (sutures), skin  glue, or adhesive strips in place. These skin closures may need to stay in place for 2 weeks or longer. If adhesive strip edges start to loosen and curl up, you may trim the loose edges. Do not remove adhesive strips completely unless your health care provider tells you to do that.  Do not wear tight clothing over the incisions. Tight clothing may rub and irritate the incision areas, which may cause the incisions to open.  Do not take baths, swim, or use a hot tub until your health care provider approves. OK TO SHOWER IN 24HRS.    Check your incision area every day for signs of infection. Check for: ? More redness, swelling, or pain. ? More fluid or blood. ? Warmth. ? Pus or a bad smell. Activity  Avoid lifting anything that is heavier than 10 lb (4.5 kg) for 2 weeks or until your health care provider says it is okay.  No pushing/pulling greater than 30lbs  You may resume normal activities as told by your health care provider. Ask your health care provider what activities are safe for you.  Take rest breaks during the day as needed. Eating and drinking  Follow instructions from your health care provider about what you can eat after surgery.  To prevent or treat constipation while you are taking prescription pain medicine, your health care provider may recommend that you: ? Drink enough fluid to keep your urine clear or pale yellow. ? Take over-the-counter or prescription medicines. ?   Eat foods that are high in fiber, such as fresh fruits and vegetables, whole grains, and beans. ? Limit foods that are high in fat and processed sugars, such as fried and sweet foods. General instructions  Ask your health care provider when you will need an appointment to get your sutures or staples removed.  Keep all follow-up visits as told by your health care provider. This is important. Contact a health care provider if:  You have more redness, swelling, or pain around your incisions.  You have  more fluid or blood coming from the incisions.  Your incisions feel warm to the touch.  You have pus or a bad smell coming from your incisions or your dressing.  You have a fever.  You have an incision that breaks open (edges not staying together) after sutures or staples have been removed. Get help right away if:  You develop a rash.  You have chest pain or difficulty breathing.  You have pain or swelling in your legs.  You feel light-headed or you faint.  Your abdomen swells (becomes distended).  You have nausea or vomiting.  You have blood in your stool (feces). This information is not intended to replace advice given to you by your health care provider. Make sure you discuss any questions you have with your health care provider. Document Released: 04/04/2005 Document Revised: 06/04/2018 Document Reviewed: 06/16/2016 Elsevier Interactive Patient Education  2019 [employer]    AMBULATORY SURGERY  DISCHARGE INSTRUCTIONS   1) The drugs that you were given will stay in your system until tomorrow so for the next 24 hours you should not:  A) Drive an automobile B) Make any legal decisions C) Drink any alcoholic beverage   2) You may resume regular meals tomorrow.  Today it is better to start with liquids and gradually work up to solid foods.  You may eat anything you prefer, but it is better to start with liquids, then soup and crackers, and gradually work up to solid foods.   3) Please notify your doctor immediately if you have any unusual bleeding, trouble breathing, redness and pain at the surgery site, drainage, fever, or pain not relieved by medication.    4) Additional Instructions:        Please contact your physician with any problems or Same Day Surgery at [phone], Monday through Friday 6 am to 4 pm, or [place] [facility] [place] Main number at [phone].  

## 2019-04-22 NOTE — Anesthesia Procedure Notes (Addendum)
Procedure Name: Intubation Date/Time: 04/22/2019 11:28 AM Performed by: Eben Burow, CRNA Pre-anesthesia Checklist: Patient identified, Emergency Drugs available, Suction available and Patient being monitored Patient Re-evaluated:Patient Re-evaluated prior to induction Oxygen Delivery Method: Circle system utilized Preoxygenation: Pre-oxygenation with 100% oxygen Induction Type: IV induction Ventilation: Mask ventilation without difficulty Laryngoscope Size: Mac and 4 Grade View: Grade II Tube type: Oral Tube size: 7.5 mm Number of attempts: 1 Airway Equipment and Method: Stylet and LTA kit utilized Placement Confirmation: ETT inserted through vocal cords under direct vision,  positive ETCO2 and breath sounds checked- equal and bilateral Secured at: 24 cm Tube secured with: Tape Dental Injury: Teeth and Oropharynx as per pre-operative assessment

## 2019-04-22 NOTE — Anesthesia Preprocedure Evaluation (Signed)
Anesthesia Evaluation  Patient identified by MRN, date of birth, ID band Patient awake    Reviewed: Allergy & Precautions, H&P , NPO status , Patient's Chart, lab work & pertinent test results  History of Anesthesia Complications Negative for: history of anesthetic complications  Airway Mallampati: II  TM Distance: >3 FB Neck ROM: limited    Dental  (+) Chipped   Pulmonary neg pulmonary ROS, neg shortness of breath,           Cardiovascular Exercise Tolerance: Good (-) angina(-) Past MI and (-) DOE negative cardio ROS       Neuro/Psych negative neurological ROS  negative psych ROS   GI/Hepatic negative GI ROS, Neg liver ROS, neg GERD  ,  Endo/Other  negative endocrine ROS  Renal/GU CRF     Musculoskeletal   Abdominal   Peds  Hematology  (+) Blood dyscrasia, anemia ,   Anesthesia Other Findings Past Medical History: No date: Hyperlipidemia No date: Macrocytic anemia No date: Pancytopenia (HCC) No date: Pernicious anemia     Comment:  POSITIVE parietal cell antibody  Past Surgical History: 2009: COLONOSCOPY WITH PROPOFOL  BMI    Body Mass Index: 23.12 kg/m      Reproductive/Obstetrics negative OB ROS                             Anesthesia Physical Anesthesia Plan  ASA: III  Anesthesia Plan: General ETT   Post-op Pain Management:    Induction: Intravenous  PONV Risk Score and Plan: Ondansetron, Dexamethasone, Midazolam and Treatment may vary due to age or medical condition  Airway Management Planned: Oral ETT  Additional Equipment:   Intra-op Plan:   Post-operative Plan: Extubation in OR  Informed Consent: I have reviewed the patients History and Physical, chart, labs and discussed the procedure including the risks, benefits and alternatives for the proposed anesthesia with the patient or authorized representative who has indicated his/her understanding and  acceptance.     Dental Advisory Given  Plan Discussed with: Anesthesiologist, CRNA and Surgeon  Anesthesia Plan Comments: (Patient consented for risks of anesthesia including but not limited to:  - adverse reactions to medications - damage to teeth, lips or other oral mucosa - sore throat or hoarseness - Damage to heart, brain, lungs or loss of life  Patient voiced understanding.)        Anesthesia Quick Evaluation

## 2019-04-22 NOTE — Interval H&P Note (Signed)
History and Physical Interval Note:  04/22/2019 11:08 AM  Mosie Lukes  has presented today for surgery, with the diagnosis of K40.90 RIGHT INGUINAL HERNIA.  The various methods of treatment have been discussed with the patient and family. After consideration of risks, benefits and other options for treatment, the patient has consented to  Procedure(s): XI ROBOTIC [place] (Right) as a surgical intervention.  The patient's history has been reviewed, patient examined, no change in status, stable for surgery.  I have reviewed the patient's chart and labs.  Questions were answered to the patient's satisfaction.     Dravin Lance Lysle Pearl

## 2019-04-24 ENCOUNTER — Encounter: Payer: Self-pay | Admitting: Surgery

## 2019-04-25 NOTE — Anesthesia Postprocedure Evaluation (Signed)
Anesthesia Post Note  Patient: CALEB DECOCK  Procedure(s) Performed: XI ROBOTIC LAPAROSCOPIC ASSISTED RIGHT INGUINAL HERNIA REPAIR (Right )  Patient location during evaluation: PACU Anesthesia Type: General Level of consciousness: awake and alert and oriented Pain management: pain level controlled Vital Signs Assessment: post-procedure vital signs reviewed and stable Respiratory status: spontaneous breathing Cardiovascular status: blood pressure returned to baseline Anesthetic complications: no     Last Vitals:  Vitals:   04/22/19 1509 04/22/19 1540  BP: 133/61 133/64  Pulse: (!) 54 60  Resp: 16 16  Temp: (!) 36.2 C   SpO2: 98% 97%    Last Pain:  Vitals:   04/25/19 0908  TempSrc:   PainSc: 2                  Yadiel Aubry

## 2019-07-07 DIAGNOSIS — N1831 Chronic kidney disease, stage 3a: Secondary | ICD-10-CM | POA: Diagnosis present

## 2020-01-31 ENCOUNTER — Encounter: Payer: Self-pay | Admitting: Ophthalmology

## 2020-02-01 ENCOUNTER — Other Ambulatory Visit: Payer: Self-pay

## 2020-02-01 ENCOUNTER — Encounter: Payer: Self-pay | Admitting: Ophthalmology

## 2020-02-03 ENCOUNTER — Other Ambulatory Visit
Admission: RE | Admit: 2020-02-03 | Discharge: 2020-02-03 | Disposition: A | Discharge status: Home / Self Care | Payer: Managed Care, Other (non HMO) | Source: Ambulatory Visit | Attending: Ophthalmology | Admitting: Ophthalmology

## 2020-02-03 DIAGNOSIS — Z01812 Encounter for preprocedural laboratory examination: Secondary | ICD-10-CM | POA: Insufficient documentation

## 2020-02-03 DIAGNOSIS — Z20822 Contact with and (suspected) exposure to covid-19: Secondary | ICD-10-CM | POA: Diagnosis not present

## 2020-02-03 LAB — SARS CORONAVIRUS 2 (TAT 6-24 HRS): SARS Coronavirus 2: NEGATIVE

## 2020-02-03 NOTE — Discharge Instructions (Signed)

## 2020-02-07 ENCOUNTER — Ambulatory Visit: Payer: Managed Care, Other (non HMO) | Admitting: Anesthesiology

## 2020-02-07 ENCOUNTER — Encounter
Admission: RE | Disposition: A | Discharge status: Home / Self Care | Payer: Self-pay | Source: Home / Self Care | Attending: Ophthalmology

## 2020-02-07 ENCOUNTER — Other Ambulatory Visit: Payer: Self-pay

## 2020-02-07 ENCOUNTER — Ambulatory Visit
Admission: RE | Admit: 2020-02-07 | Discharge: 2020-02-07 | Disposition: A | Discharge status: Home / Self Care | Payer: Managed Care, Other (non HMO) | Attending: Ophthalmology | Admitting: Ophthalmology

## 2020-02-07 ENCOUNTER — Encounter: Payer: Self-pay | Admitting: Ophthalmology

## 2020-02-07 DIAGNOSIS — N1831 Chronic kidney disease, stage 3a: Secondary | ICD-10-CM | POA: Insufficient documentation

## 2020-02-07 DIAGNOSIS — H2511 Age-related nuclear cataract, right eye: Secondary | ICD-10-CM | POA: Insufficient documentation

## 2020-02-07 DIAGNOSIS — Z79899 Other long term (current) drug therapy: Secondary | ICD-10-CM | POA: Insufficient documentation

## 2020-02-07 HISTORY — DX: Personal history of urinary calculi: Z87.442

## 2020-02-07 HISTORY — PX: CATARACT EXTRACTION W/PHACO: SHX586

## 2020-02-07 HISTORY — DX: Deficiency of other specified B group vitamins: E53.8

## 2020-02-07 SURGERY — PHACOEMULSIFICATION, CATARACT, WITH IOL INSERTION
Anesthesia: Monitor Anesthesia Care | Site: Eye | Laterality: Right

## 2020-02-07 MED ORDER — ARMC OPHTHALMIC DILATING DROPS
1.0000 "application " | OPHTHALMIC | Status: DC | PRN
  Administered 2020-02-07 (×3): 1 via OPHTHALMIC

## 2020-02-07 MED ORDER — MIDAZOLAM HCL 2 MG/2ML IJ SOLN
INTRAMUSCULAR | Status: DC | PRN
  Administered 2020-02-07: 1 mg via INTRAVENOUS

## 2020-02-07 MED ORDER — LIDOCAINE HCL (PF) 2 % IJ SOLN
INTRAOCULAR | Status: DC | PRN
  Administered 2020-02-07: 2 mL

## 2020-02-07 MED ORDER — EPINEPHRINE PF 1 MG/ML IJ SOLN
INTRAOCULAR | Status: DC | PRN
  Administered 2020-02-07: 47 mL via OPHTHALMIC

## 2020-02-07 MED ORDER — BRIMONIDINE TARTRATE-TIMOLOL 0.2-0.5 % OP SOLN
OPHTHALMIC | Status: DC | PRN
  Administered 2020-02-07: 1 [drp] via OPHTHALMIC

## 2020-02-07 MED ORDER — MOXIFLOXACIN HCL 0.5 % OP SOLN
OPHTHALMIC | Status: DC | PRN
  Administered 2020-02-07: 0.2 mL via OPHTHALMIC

## 2020-02-07 MED ORDER — NA CHONDROIT SULF-NA HYALURON 40-17 MG/ML IO SOLN
INTRAOCULAR | Status: DC | PRN
  Administered 2020-02-07: 1 mL via INTRAOCULAR

## 2020-02-07 MED ORDER — FENTANYL CITRATE (PF) 100 MCG/2ML IJ SOLN
INTRAMUSCULAR | Status: DC | PRN
  Administered 2020-02-07: 50 ug via INTRAVENOUS

## 2020-02-07 MED ORDER — TETRACAINE HCL 0.5 % OP SOLN
1.0000 [drp] | OPHTHALMIC | Status: DC | PRN
  Administered 2020-02-07 (×3): 1 [drp] via OPHTHALMIC

## 2020-02-07 SURGICAL SUPPLY — 19 items
CANNULA ANT/CHMB 27GA (MISCELLANEOUS) ×6 IMPLANT
GLOVE SURG LX 8.0 MICRO (GLOVE) ×2
GLOVE SURG LX STRL 8.0 MICRO (GLOVE) ×1 IMPLANT
GLOVE SURG TRIUMPH 8.0 PF LTX (GLOVE) ×3 IMPLANT
GOWN STRL REUS W/ TWL LRG LVL3 (GOWN DISPOSABLE) ×2 IMPLANT
GOWN STRL REUS W/TWL LRG LVL3 (GOWN DISPOSABLE) ×4
LENS IOL ACRSF IQ TRC 7 20.0 ×1 IMPLANT
LENS IOL ACRYSOF IQ TORIC 20.0 ×2 IMPLANT
LENS IOL IQ TORIC 7 20.0 ×1 IMPLANT
MARKER SKIN DUAL TIP RULER LAB (MISCELLANEOUS) ×3 IMPLANT
NEEDLE FILTER BLUNT 18X 1/2SAF (NEEDLE) ×2
NEEDLE FILTER BLUNT 18X1 1/2 (NEEDLE) ×1 IMPLANT
PACK EYE AFTER SURG (MISCELLANEOUS) ×3 IMPLANT
PACK OPTHALMIC (MISCELLANEOUS) ×3 IMPLANT
PACK PORFILIO (MISCELLANEOUS) ×3 IMPLANT
SYR 3ML LL SCALE MARK (SYRINGE) ×3 IMPLANT
SYR TB 1ML LUER SLIP (SYRINGE) ×3 IMPLANT
WATER STERILE IRR 250ML POUR (IV SOLUTION) ×3 IMPLANT
WIPE NON LINTING 3.25X3.25 (MISCELLANEOUS) ×3 IMPLANT

## 2020-02-07 NOTE — Op Note (Signed)
PREOPERATIVE DIAGNOSIS:  Nuclear sclerotic cataract of the right eye.   POSTOPERATIVE DIAGNOSIS:  Nuclear sclerotic cataract of the right eye.   OPERATIVE PROCEDURE: Procedure(s): CATARACT EXTRACTION PHACO AND INTRAOCULAR LENS PLACEMENT (IOC) RIGHT  TORIC LENS 10.74 00:59.4    SURGEON:  Galen Manila, MD.   ANESTHESIA: 1.      Managed anesthesia care. 2.     0.57ml of Shugarcaine was instilled following the paracentesis  Anesthesiologist: Fletcher Anon, MD CRNA: Maree Krabbe, CRNA  COMPLICATIONS:  None.   TECHNIQUE:   Stop and chop    DESCRIPTION OF PROCEDURE:  The patient was examined and consented in the preoperative holding area where the aforementioned topical anesthesia was applied to the right eye.  The patient was brought back to the Operating Room where he was sat upright on the gurney and given a target to fixate upon while the eye was marked at the 3:00 and 9:00 position.  The patient was then reclined on the operating table.  The eye was prepped and draped in the usual sterile ophthalmic fashion and a lid speculum was placed. A paracentesis was created with the side port blade and the anterior chamber was filled with viscoelastic. A near clear corneal incision was performed with the steel keratome. A continuous curvilinear capsulorrhexis was performed with a cystotome followed by the capsulorrhexis forceps. Hydrodissection and hydrodelineation were carried out with BSS on a blunt cannula. The lens was removed in a stop and chop technique and the remaining cortical material was removed with the irrigation-aspiration handpiece. The eye was inflated with viscoelastic and the SN6AT  lens  was placed in the eye and rotated to within a few degrees of the predetermined orientation.  The remaining viscoelastic was removed from the eye.  The Sinskey hook was used to rotate the toric lens into its final resting place at  180 degrees.  0. The eye was inflated to a physiologic pressure and found  to be watertight. 0.24ml of Vigamox was placed in the anterior chamber.  The eye was dressed with Vigamox. The patient was given protective glasses to wear throughout the day and a shield with which to sleep tonight. The patient was also given drops with which to begin a drop regimen today and will follow-up with me in one day. Implant Name Type Inv. Item Serial No. Manufacturer Lot No. LRB No. Used Action  LENS ACRYSOF IQ [place] - [id number]  LENS ACRYSOF IQ TORIC [id number] ALCON  Right 1 Implanted   Procedure(s): CATARACT EXTRACTION PHACO AND INTRAOCULAR LENS PLACEMENT (IOC) RIGHT  TORIC LENS 10.74 00:59.4  (Right)  Electronically signed: Galen Manila 02/07/2020 9:25 AM

## 2020-02-07 NOTE — Transfer of Care (Signed)
Immediate Anesthesia Transfer of Care Note  Patient: Donald Savage  Procedure(s) Performed: CATARACT EXTRACTION PHACO AND INTRAOCULAR LENS PLACEMENT (IOC) RIGHT  TORIC LENS 10.74 00:59.4  (Right Eye)  Patient Location: PACU  Anesthesia Type: MAC  Level of Consciousness: awake, alert  and patient cooperative  Airway and Oxygen Therapy: Patient Spontanous Breathing and Patient connected to supplemental oxygen  Post-op Assessment: Post-op Vital signs reviewed, Patient's Cardiovascular Status Stable, Respiratory Function Stable, Patent Airway and No signs of Nausea or vomiting  Post-op Vital Signs: Reviewed and stable  Complications: No apparent anesthesia complications

## 2020-02-07 NOTE — Anesthesia Procedure Notes (Addendum)
Procedure Name: MAC Date/Time: 02/07/2020 9:06 AM Performed by: Cameron Ali, CRNA Pre-anesthesia Checklist: Patient identified, Emergency Drugs available, Suction available, Timeout performed and Patient being monitored Patient Re-evaluated:Patient Re-evaluated prior to induction Oxygen Delivery Method: Nasal cannula Placement Confirmation: positive ETCO2

## 2020-02-07 NOTE — H&P (Signed)
All labs reviewed. Abnormal studies sent to patients PCP when indicated.  Previous H&P reviewed, patient examined, there are NO CHANGES.  Donald Ells Porfilio5/11/20218:59 AM

## 2020-02-07 NOTE — Anesthesia Preprocedure Evaluation (Addendum)
Anesthesia Evaluation  Patient identified by MRN, date of birth, ID band Patient awake    Reviewed: Allergy & Precautions, H&P , NPO status , Patient's Chart, lab work & pertinent test results  History of Anesthesia Complications Negative for: history of anesthetic complications  Airway Mallampati: II  TM Distance: >3 FB Neck ROM: limited    Dental  (+) Chipped   Pulmonary neg pulmonary ROS,    Pulmonary exam normal        Cardiovascular Exercise Tolerance: Good negative cardio ROS Normal cardiovascular exam     Neuro/Psych negative neurological ROS  negative psych ROS   GI/Hepatic negative GI ROS, Neg liver ROS,   Endo/Other  negative endocrine ROS  Renal/GU   negative genitourinary   Musculoskeletal negative musculoskeletal ROS (+)   Abdominal Normal abdominal exam  (+)   Peds  Hematology negative hematology ROS (+)   Anesthesia Other Findings   Reproductive/Obstetrics negative OB ROS                            Anesthesia Physical  Anesthesia Plan  ASA: II  Anesthesia Plan: MAC   Post-op Pain Management:    Induction: Intravenous  PONV Risk Score and Plan: 1 and Midazolam, Treatment may vary due to age or medical condition and TIVA  Airway Management Planned: Natural Airway and Nasal Cannula  Additional Equipment:   Intra-op Plan:   Post-operative Plan: Extubation in OR  Informed Consent: I have reviewed the patients History and Physical, chart, labs and discussed the procedure including the risks, benefits and alternatives for the proposed anesthesia with the patient or authorized representative who has indicated his/her understanding and acceptance.     Dental Advisory Given  Plan Discussed with: CRNA  Anesthesia Plan Comments:         Anesthesia Quick Evaluation

## 2020-02-07 NOTE — Anesthesia Postprocedure Evaluation (Signed)
Anesthesia Post Note  Patient: Donald Savage  Procedure(s) Performed: CATARACT EXTRACTION PHACO AND INTRAOCULAR LENS PLACEMENT (IOC) RIGHT  TORIC LENS 10.74 00:59.4  (Right Eye)     Anesthesia Post Evaluation  Fletcher Anon

## 2020-02-08 ENCOUNTER — Encounter: Payer: Self-pay | Admitting: *Deleted

## 2020-02-28 ENCOUNTER — Other Ambulatory Visit: Payer: Self-pay

## 2020-02-28 ENCOUNTER — Encounter: Payer: Self-pay | Admitting: Ophthalmology

## 2020-03-02 ENCOUNTER — Other Ambulatory Visit
Admission: RE | Admit: 2020-03-02 | Discharge: 2020-03-02 | Disposition: A | Discharge status: Home / Self Care | Payer: Managed Care, Other (non HMO) | Source: Ambulatory Visit | Attending: Ophthalmology | Admitting: Ophthalmology

## 2020-03-02 ENCOUNTER — Other Ambulatory Visit: Payer: Self-pay

## 2020-03-02 DIAGNOSIS — Z01812 Encounter for preprocedural laboratory examination: Secondary | ICD-10-CM | POA: Diagnosis present

## 2020-03-02 DIAGNOSIS — Z20822 Contact with and (suspected) exposure to covid-19: Secondary | ICD-10-CM | POA: Diagnosis not present

## 2020-03-02 LAB — SARS CORONAVIRUS 2 (TAT 6-24 HRS): SARS Coronavirus 2: NEGATIVE

## 2020-03-02 NOTE — Discharge Instructions (Signed)

## 2020-03-06 ENCOUNTER — Ambulatory Visit: Payer: Managed Care, Other (non HMO) | Admitting: Anesthesiology

## 2020-03-06 ENCOUNTER — Encounter
Admission: RE | Disposition: A | Discharge status: Home / Self Care | Payer: Self-pay | Source: Home / Self Care | Attending: Ophthalmology

## 2020-03-06 ENCOUNTER — Encounter: Payer: Self-pay | Admitting: Ophthalmology

## 2020-03-06 ENCOUNTER — Ambulatory Visit
Admission: RE | Admit: 2020-03-06 | Discharge: 2020-03-06 | Disposition: A | Discharge status: Home / Self Care | Payer: Managed Care, Other (non HMO) | Attending: Ophthalmology | Admitting: Ophthalmology

## 2020-03-06 DIAGNOSIS — Z79899 Other long term (current) drug therapy: Secondary | ICD-10-CM | POA: Insufficient documentation

## 2020-03-06 DIAGNOSIS — M1990 Unspecified osteoarthritis, unspecified site: Secondary | ICD-10-CM | POA: Insufficient documentation

## 2020-03-06 DIAGNOSIS — E538 Deficiency of other specified B group vitamins: Secondary | ICD-10-CM | POA: Diagnosis not present

## 2020-03-06 DIAGNOSIS — H2512 Age-related nuclear cataract, left eye: Secondary | ICD-10-CM | POA: Diagnosis not present

## 2020-03-06 HISTORY — PX: CATARACT EXTRACTION W/PHACO: SHX586

## 2020-03-06 SURGERY — PHACOEMULSIFICATION, CATARACT, WITH IOL INSERTION
Anesthesia: Monitor Anesthesia Care | Site: Eye | Laterality: Left

## 2020-03-06 MED ORDER — MIDAZOLAM HCL 2 MG/2ML IJ SOLN
INTRAMUSCULAR | Status: DC | PRN
  Administered 2020-03-06: 2 mg via INTRAVENOUS

## 2020-03-06 MED ORDER — LIDOCAINE HCL (PF) 2 % IJ SOLN
INTRAOCULAR | Status: DC | PRN
  Administered 2020-03-06: 1 mL

## 2020-03-06 MED ORDER — FENTANYL CITRATE (PF) 100 MCG/2ML IJ SOLN
INTRAMUSCULAR | Status: DC | PRN
  Administered 2020-03-06 (×2): 50 ug via INTRAVENOUS

## 2020-03-06 MED ORDER — MOXIFLOXACIN HCL 0.5 % OP SOLN
OPHTHALMIC | Status: DC | PRN
  Administered 2020-03-06: 0.2 mL via OPHTHALMIC

## 2020-03-06 MED ORDER — ARMC OPHTHALMIC DILATING DROPS
1.0000 "application " | OPHTHALMIC | Status: DC | PRN
  Administered 2020-03-06 (×3): 1 via OPHTHALMIC

## 2020-03-06 MED ORDER — TETRACAINE HCL 0.5 % OP SOLN
1.0000 [drp] | OPHTHALMIC | Status: DC | PRN
  Administered 2020-03-06 (×3): 1 [drp] via OPHTHALMIC

## 2020-03-06 MED ORDER — EPINEPHRINE PF 1 MG/ML IJ SOLN
INTRAOCULAR | Status: DC | PRN
  Administered 2020-03-06: 57 mL via OPHTHALMIC

## 2020-03-06 MED ORDER — NA CHONDROIT SULF-NA HYALURON 40-17 MG/ML IO SOLN
INTRAOCULAR | Status: DC | PRN
  Administered 2020-03-06: 1 mL via INTRAOCULAR

## 2020-03-06 MED ORDER — BRIMONIDINE TARTRATE-TIMOLOL 0.2-0.5 % OP SOLN
OPHTHALMIC | Status: DC | PRN
  Administered 2020-03-06: 1 [drp] via OPHTHALMIC

## 2020-03-06 SURGICAL SUPPLY — 19 items
ACRYSOF IQ TORIC IOL (Intraocular Lens) ×2 IMPLANT
CANNULA ANT/CHMB 27G (MISCELLANEOUS) ×2 IMPLANT
CANNULA ANT/CHMB 27GA (MISCELLANEOUS) ×6 IMPLANT
GLOVE SURG LX 8.0 MICRO (GLOVE) ×2
GLOVE SURG LX STRL 8.0 MICRO (GLOVE) ×1 IMPLANT
GLOVE SURG TRIUMPH 8.0 PF LTX (GLOVE) ×3 IMPLANT
GOWN STRL REUS W/ TWL LRG LVL3 (GOWN DISPOSABLE) ×2 IMPLANT
GOWN STRL REUS W/TWL LRG LVL3 (GOWN DISPOSABLE) ×4
MARKER SKIN DUAL TIP RULER LAB (MISCELLANEOUS) ×3 IMPLANT
NDL FILTER BLUNT 18X1 1/2 (NEEDLE) ×1 IMPLANT
NEEDLE FILTER BLUNT 18X 1/2SAF (NEEDLE) ×2
NEEDLE FILTER BLUNT 18X1 1/2 (NEEDLE) ×1 IMPLANT
PACK EYE AFTER SURG (MISCELLANEOUS) ×3 IMPLANT
PACK OPTHALMIC (MISCELLANEOUS) ×3 IMPLANT
PACK PORFILIO (MISCELLANEOUS) ×3 IMPLANT
SYR 3ML LL SCALE MARK (SYRINGE) ×3 IMPLANT
SYR TB 1ML LUER SLIP (SYRINGE) ×3 IMPLANT
WATER STERILE IRR 250ML POUR (IV SOLUTION) ×3 IMPLANT
WIPE NON LINTING 3.25X3.25 (MISCELLANEOUS) ×3 IMPLANT

## 2020-03-06 NOTE — Anesthesia Preprocedure Evaluation (Signed)
Anesthesia Evaluation  Patient identified by MRN, date of birth, ID band Patient awake    History of Anesthesia Complications Negative for: history of anesthetic complications  Airway Mallampati: II  TM Distance: >3 FB Neck ROM: Full    Dental no notable dental hx.    Pulmonary neg pulmonary ROS,    Pulmonary exam normal        Cardiovascular Exercise Tolerance: Good negative cardio ROS Normal cardiovascular exam     Neuro/Psych negative neurological ROS     GI/Hepatic negative GI ROS, Neg liver ROS,   Endo/Other  negative endocrine ROS  Renal/GU negative Renal ROS     Musculoskeletal negative musculoskeletal ROS (+)   Abdominal   Peds  Hematology negative hematology ROS (+)   Anesthesia Other Findings   Reproductive/Obstetrics                             Anesthesia Physical Anesthesia Plan  ASA: I  Anesthesia Plan: MAC   Post-op Pain Management:    Induction: Intravenous  PONV Risk Score and Plan: 1 and Midazolam, Treatment may vary due to age or medical condition and TIVA  Airway Management Planned: Nasal Cannula and Natural Airway  Additional Equipment: None  Intra-op Plan:   Post-operative Plan:   Informed Consent: I have reviewed the patients History and Physical, chart, labs and discussed the procedure including the risks, benefits and alternatives for the proposed anesthesia with the patient or authorized representative who has indicated his/her understanding and acceptance.       Plan Discussed with: CRNA  Anesthesia Plan Comments:         Anesthesia Quick Evaluation

## 2020-03-06 NOTE — Anesthesia Procedure Notes (Signed)
Procedure Name: MAC Date/Time: 03/06/2020 8:32 AM Performed by: Jeannene Patella, CRNA Pre-anesthesia Checklist: Patient identified, Emergency Drugs available, Suction available, Patient being monitored and Timeout performed Patient Re-evaluated:Patient Re-evaluated prior to induction Oxygen Delivery Method: Nasal cannula

## 2020-03-06 NOTE — Transfer of Care (Signed)
Immediate Anesthesia Transfer of Care Note  Patient: Donald Savage  Procedure(s) Performed: CATARACT EXTRACTION PHACO AND INTRAOCULAR LENS PLACEMENT (IOC) LEFT TORIC LENS 8.47  00:51.6 (Left Eye)  Patient Location: PACU  Anesthesia Type: MAC  Level of Consciousness: awake, alert  and patient cooperative  Airway and Oxygen Therapy: Patient Spontanous Breathing and Patient connected to supplemental oxygen  Post-op Assessment: Post-op Vital signs reviewed, Patient's Cardiovascular Status Stable, Respiratory Function Stable, Patent Airway and No signs of Nausea or vomiting  Post-op Vital Signs: Reviewed and stable  Complications: No apparent anesthesia complications

## 2020-03-06 NOTE — Op Note (Signed)
PREOPERATIVE DIAGNOSIS:  Nuclear sclerotic cataract of the left eye.   POSTOPERATIVE DIAGNOSIS:  Nuclear sclerotic cataract of the left eye.   OPERATIVE PROCEDURE: Procedure(s): CATARACT EXTRACTION PHACO AND INTRAOCULAR LENS PLACEMENT (IOC) LEFT TORIC LENS 8.47  00:51.6   SURGEON:  Galen Manila, MD.   ANESTHESIA: 1.      Managed anesthesia care. 2.     0.77ml os Shugarcaine was instilled following the paracentesis [email or web address]   COMPLICATIONS:  None.   TECHNIQUE:   Stop and chop    DESCRIPTION OF PROCEDURE:  The patient was examined and consented in the preoperative holding area where the aforementioned topical anesthesia was applied to the left eye.  The patient was brought back to the Operating Room where he was sat upright on the gurney and given a target to fixate upon while the eye was marked at the 3:00 and 9:00 position.  The patient was then reclined on the operating table.  The eye was prepped and draped in the usual sterile ophthalmic fashion and a lid speculum was placed. A paracentesis was created with the side port blade and the anterior chamber was filled with viscoelastic. A near clear corneal incision was performed with the steel keratome. A continuous curvilinear capsulorrhexis was performed with a cystotome followed by the capsulorrhexis forceps. Hydrodissection and hydrodelineation were carried out with BSS on a blunt cannula. The lens was removed in a stop and chop technique and the remaining cortical material was removed with the irrigation-aspiration handpiece. The eye was inflated with viscoelastic and the SN6AT4  was placed in the eye and rotated to within a few degrees of the predetermined orientation.  The remaining viscoelastic was removed from the eye.  The Sinskey hook was used to rotate the toric lens into its final resting place at 008 degrees.  0.1 ml of Vigamox was placed in the anterior chamber. The eye was inflated to a physiologic pressure and found to be  watertight.  The eye was dressed with Vigamox. The patient was given protective glasses to wear throughout the day and a shield with which to sleep tonight. The patient was also given drops with which to begin a drop regimen today and will follow-up with me in one day. Implant Name Type Inv. Item Serial No. Manufacturer Lot No. LRB No. Used Action  ACRYSOF IQ TORIC IOL Intraocular Lens  [id number]   Left 1 Implanted   Procedure(s): CATARACT EXTRACTION PHACO AND INTRAOCULAR LENS PLACEMENT (IOC) LEFT TORIC LENS 8.47  00:51.6 (Left)  Electronically signed: Galen Manila 6/8/20218:48 AM

## 2020-03-06 NOTE — H&P (Signed)
All labs reviewed. Abnormal studies sent to patients PCP when indicated.  Previous H&P reviewed, patient examined, there are NO CHANGES.  Donald Chichester Porfilio6/8/20218:21 AM

## 2020-03-06 NOTE — Anesthesia Postprocedure Evaluation (Signed)
Anesthesia Post Note  Patient: Donald Savage  Procedure(s) Performed: CATARACT EXTRACTION PHACO AND INTRAOCULAR LENS PLACEMENT (IOC) LEFT TORIC LENS 8.47  00:51.6 (Left Eye)     Patient location during evaluation: PACU Anesthesia Type: MAC Level of consciousness: awake and alert Pain management: pain level controlled Vital Signs Assessment: post-procedure vital signs reviewed and stable Respiratory status: spontaneous breathing Cardiovascular status: blood pressure returned to baseline Postop Assessment: no apparent nausea or vomiting, adequate PO intake and no headache Anesthetic complications: no    Adele Barthel Chadrick Sprinkle

## 2020-03-08 ENCOUNTER — Encounter: Payer: Self-pay | Admitting: Ophthalmology

## 2021-07-30 ENCOUNTER — Ambulatory Visit (LOCAL_COMMUNITY_HEALTH_CENTER): Payer: 59

## 2021-07-30 ENCOUNTER — Other Ambulatory Visit: Payer: Self-pay

## 2021-07-30 DIAGNOSIS — Z23 Encounter for immunization: Secondary | ICD-10-CM

## 2021-07-30 NOTE — Progress Notes (Signed)
In Nurse Clinic for Flu vaccine. High Dose flu vaccine given and tolerated well. Updated [facility] copy given. Jerel Shepherd, RN

## 2021-10-03 DIAGNOSIS — E118 Type 2 diabetes mellitus with unspecified complications: Secondary | ICD-10-CM | POA: Diagnosis present

## 2021-10-04 DIAGNOSIS — H353131 Nonexudative age-related macular degeneration, bilateral, early dry stage: Secondary | ICD-10-CM | POA: Diagnosis not present

## 2022-09-08 ENCOUNTER — Ambulatory Visit (LOCAL_COMMUNITY_HEALTH_CENTER): Payer: 59

## 2022-09-08 DIAGNOSIS — Z7185 Encounter for immunization safety counseling: Secondary | ICD-10-CM

## 2022-09-08 DIAGNOSIS — Z23 Encounter for immunization: Secondary | ICD-10-CM | POA: Diagnosis not present

## 2022-09-08 NOTE — Progress Notes (Signed)
  Are you feeling sick today? No   Have you ever received a dose of COVID-19 Vaccine? [employer], [place], [place], [place], Other) Yes  If yes, which vaccine and how many doses?   Pfizer and 4 doses   Did you bring the vaccination record card or other documentation?  Yes   Do you have a health condition or are undergoing treatment that makes you moderately or severely immunocompromised? This would include, but not be limited to: cancer, HIV, organ transplant, immunosuppressive therapy/high-dose corticosteroids, or moderate/severe primary immunodeficiency.  No  Have you received COVID-19 vaccine before or during hematopoietic cell transplant (HCT) or CAR-T-cell therapies? No  Have you ever had an allergic reaction to: (This would include a severe allergic reaction or a reaction that caused hives, swelling, or respiratory distress, including wheezing.) A component of a COVID-19 vaccine or a previous dose of COVID-19 vaccine? No   Have you ever had an allergic reaction to another vaccine (other thanCOVID-19 vaccine) or an injectable medication? (This would include a severe allergic reaction or a reaction that caused hives, swelling, or respiratory distress, including wheezing.)   No    Do you have a history of any of the following:  Myocarditis or Pericarditis No  Dermal fillers:  No  Multisystem Inflammatory Syndrome (MIS-C or MIS-A)? No  COVID-19 disease within the past 3 months? No  Vaccinated with monkeypox vaccine in the last 4 weeks? No  Covid Pfizer Comirnaty [phone] (12 yrs+) and High Dose Flu vaccine given today Tolerated well. Stayed for 15 min observation without problem. Updated [facility] copy given. Jerel Shepherd, RN

## 2022-10-06 DIAGNOSIS — M3501 Sicca syndrome with keratoconjunctivitis: Secondary | ICD-10-CM | POA: Diagnosis not present

## 2022-10-06 DIAGNOSIS — H353131 Nonexudative age-related macular degeneration, bilateral, early dry stage: Secondary | ICD-10-CM | POA: Diagnosis not present

## 2022-10-06 DIAGNOSIS — H40039 Anatomical narrow angle, unspecified eye: Secondary | ICD-10-CM | POA: Diagnosis not present

## 2022-10-24 DIAGNOSIS — Z0000 Encounter for general adult medical examination without abnormal findings: Secondary | ICD-10-CM | POA: Diagnosis not present

## 2023-04-07 DIAGNOSIS — E118 Type 2 diabetes mellitus with unspecified complications: Secondary | ICD-10-CM | POA: Diagnosis not present

## 2023-04-14 DIAGNOSIS — E118 Type 2 diabetes mellitus with unspecified complications: Secondary | ICD-10-CM | POA: Diagnosis not present

## 2023-04-14 DIAGNOSIS — E538 Deficiency of other specified B group vitamins: Secondary | ICD-10-CM | POA: Diagnosis not present

## 2023-04-14 DIAGNOSIS — N1831 Chronic kidney disease, stage 3a: Secondary | ICD-10-CM | POA: Diagnosis not present

## 2023-10-01 ENCOUNTER — Telehealth: Payer: Self-pay | Admitting: Primary Care

## 2023-10-01 NOTE — Telephone Encounter (Signed)
 error

## 2024-01-07 ENCOUNTER — Observation Stay
Admission: EM | Admit: 2024-01-07 | Discharge: 2024-01-08 | Disposition: A | Discharge status: Home / Self Care | Attending: Internal Medicine | Admitting: Internal Medicine

## 2024-01-07 ENCOUNTER — Emergency Department

## 2024-01-07 ENCOUNTER — Observation Stay

## 2024-01-07 ENCOUNTER — Other Ambulatory Visit: Payer: Self-pay

## 2024-01-07 DIAGNOSIS — Z79899 Other long term (current) drug therapy: Secondary | ICD-10-CM | POA: Insufficient documentation

## 2024-01-07 DIAGNOSIS — M25511 Pain in right shoulder: Secondary | ICD-10-CM | POA: Diagnosis not present

## 2024-01-07 DIAGNOSIS — N1831 Chronic kidney disease, stage 3a: Secondary | ICD-10-CM | POA: Diagnosis not present

## 2024-01-07 DIAGNOSIS — R413 Other amnesia: Principal | ICD-10-CM

## 2024-01-07 DIAGNOSIS — R4182 Altered mental status, unspecified: Principal | ICD-10-CM | POA: Insufficient documentation

## 2024-01-07 DIAGNOSIS — I129 Hypertensive chronic kidney disease with stage 1 through stage 4 chronic kidney disease, or unspecified chronic kidney disease: Secondary | ICD-10-CM | POA: Diagnosis not present

## 2024-01-07 DIAGNOSIS — D519 Vitamin B12 deficiency anemia, unspecified: Secondary | ICD-10-CM | POA: Diagnosis not present

## 2024-01-07 DIAGNOSIS — I6523 Occlusion and stenosis of bilateral carotid arteries: Secondary | ICD-10-CM | POA: Diagnosis not present

## 2024-01-07 DIAGNOSIS — R9089 Other abnormal findings on diagnostic imaging of central nervous system: Secondary | ICD-10-CM | POA: Diagnosis not present

## 2024-01-07 DIAGNOSIS — I10 Essential (primary) hypertension: Secondary | ICD-10-CM | POA: Diagnosis not present

## 2024-01-07 DIAGNOSIS — R569 Unspecified convulsions: Secondary | ICD-10-CM | POA: Diagnosis not present

## 2024-01-07 DIAGNOSIS — Z7901 Long term (current) use of anticoagulants: Secondary | ICD-10-CM | POA: Insufficient documentation

## 2024-01-07 DIAGNOSIS — E538 Deficiency of other specified B group vitamins: Secondary | ICD-10-CM

## 2024-01-07 DIAGNOSIS — R29818 Other symptoms and signs involving the nervous system: Secondary | ICD-10-CM | POA: Diagnosis not present

## 2024-01-07 DIAGNOSIS — E118 Type 2 diabetes mellitus with unspecified complications: Secondary | ICD-10-CM

## 2024-01-07 DIAGNOSIS — E1122 Type 2 diabetes mellitus with diabetic chronic kidney disease: Secondary | ICD-10-CM | POA: Insufficient documentation

## 2024-01-07 DIAGNOSIS — R404 Transient alteration of awareness: Secondary | ICD-10-CM

## 2024-01-07 DIAGNOSIS — G454 Transient global amnesia: Secondary | ICD-10-CM | POA: Diagnosis not present

## 2024-01-07 DIAGNOSIS — I700 Atherosclerosis of aorta: Secondary | ICD-10-CM | POA: Diagnosis not present

## 2024-01-07 LAB — URINE DRUG SCREEN, QUALITATIVE (ARMC ONLY)
Amphetamines, Ur Screen: NOT DETECTED
Barbiturates, Ur Screen: NOT DETECTED
Benzodiazepine, Ur Scrn: NOT DETECTED
Cannabinoid 50 Ng, Ur ~~LOC~~: NOT DETECTED
Cocaine Metabolite,Ur ~~LOC~~: NOT DETECTED
MDMA (Ecstasy)Ur Screen: NOT DETECTED
Methadone Scn, Ur: NOT DETECTED
Opiate, Ur Screen: POSITIVE — AB
Phencyclidine (PCP) Ur S: NOT DETECTED
Tricyclic, Ur Screen: NOT DETECTED

## 2024-01-07 LAB — URINALYSIS, ROUTINE W REFLEX MICROSCOPIC
Bilirubin Urine: NEGATIVE
Glucose, UA: NEGATIVE mg/dL
Hgb urine dipstick: NEGATIVE
Ketones, ur: NEGATIVE mg/dL
Leukocytes,Ua: NEGATIVE
Nitrite: NEGATIVE
Protein, ur: NEGATIVE mg/dL
Specific Gravity, Urine: 1.019 (ref 1.005–1.030)
pH: 5 (ref 5.0–8.0)

## 2024-01-07 LAB — COMPREHENSIVE METABOLIC PANEL WITH GFR
ALT: 16 U/L (ref 0–44)
AST: 17 U/L (ref 15–41)
Albumin: 3.9 g/dL (ref 3.5–5.0)
Alkaline Phosphatase: 60 U/L (ref 38–126)
Anion gap: 10 (ref 5–15)
BUN: 23 mg/dL (ref 8–23)
CO2: 24 mmol/L (ref 22–32)
Calcium: 9.3 mg/dL (ref 8.9–10.3)
Chloride: 105 mmol/L (ref 98–111)
Creatinine, Ser: 1.21 mg/dL (ref 0.61–1.24)
GFR, Estimated: >60 mL/min (ref >60)
Glucose, Bld: 102 mg/dL — ABNORMAL HIGH (ref 70–99)
Potassium: 4 mmol/L (ref 3.5–5.1)
Sodium: 139 mmol/L (ref 135–145)
Total Bilirubin: 1.1 mg/dL (ref 0.0–1.2)
Total Protein: 6.9 g/dL (ref 6.5–8.1)

## 2024-01-07 LAB — CBC WITH DIFFERENTIAL/PLATELET
Abs Immature Granulocytes: 0.03 10*3/uL (ref 0.00–0.07)
Basophils Absolute: 0 10*3/uL (ref 0.0–0.1)
Basophils Relative: 0 %
Eosinophils Absolute: 0.1 10*3/uL (ref 0.0–0.5)
Eosinophils Relative: 1 %
HCT: 40.9 % (ref 39.0–52.0)
Hemoglobin: 13.7 g/dL (ref 13.0–17.0)
Immature Granulocytes: 0 %
Lymphocytes Relative: 16 %
Lymphs Abs: 1.5 10*3/uL (ref 0.7–4.0)
MCH: 30.6 pg (ref 26.0–34.0)
MCHC: 33.5 g/dL (ref 30.0–36.0)
MCV: 91.3 fL (ref 80.0–100.0)
Monocytes Absolute: 0.5 10*3/uL (ref 0.1–1.0)
Monocytes Relative: 5 %
Neutro Abs: 7.4 10*3/uL (ref 1.7–7.7)
Neutrophils Relative %: 78 %
Platelets: 212 10*3/uL (ref 150–400)
RBC: 4.48 MIL/uL (ref 4.22–5.81)
RDW: 12.3 % (ref 11.5–15.5)
WBC: 9.5 10*3/uL (ref 4.0–10.5)
nRBC: 0 % (ref 0.0–0.2)

## 2024-01-07 LAB — ETHANOL: Alcohol, Ethyl (B): <10 mg/dL (ref <10)

## 2024-01-07 LAB — TROPONIN I (HIGH SENSITIVITY): Troponin I (High Sensitivity): 5 ng/L (ref <18)

## 2024-01-07 MED ORDER — POLYETHYLENE GLYCOL 3350 17 G PO PACK: 17.0000 g | PACK | Freq: Every day | ORAL | Status: DC | PRN

## 2024-01-07 MED ORDER — CELECOXIB 100 MG PO CAPS: 100.0000 mg | ORAL_CAPSULE | Freq: Two times a day (BID) | ORAL | Status: DC

## 2024-01-07 MED ORDER — ONDANSETRON HCL 4 MG PO TABS: 4.0000 mg | ORAL_TABLET | Freq: Four times a day (QID) | ORAL | Status: DC | PRN

## 2024-01-07 MED ORDER — ACETAMINOPHEN 325 MG PO TABS: 650.0000 mg | ORAL_TABLET | Freq: Four times a day (QID) | ORAL | Status: DC | PRN

## 2024-01-07 MED ORDER — ASPIRIN 81 MG PO TBEC
81.0000 mg | DELAYED_RELEASE_TABLET | Freq: Every day | ORAL | Status: DC
  Administered 2024-01-08: 81 mg via ORAL
  Filled 2024-01-07: qty 1

## 2024-01-07 MED ORDER — IOHEXOL 350 MG/ML SOLN
75.0000 mL | Freq: Once | INTRAVENOUS | Status: AC | PRN
  Administered 2024-01-07: 75 mL via INTRAVENOUS

## 2024-01-07 MED ORDER — SODIUM CHLORIDE 0.9% FLUSH
3.0000 mL | Freq: Two times a day (BID) | INTRAVENOUS | Status: DC
  Administered 2024-01-07 – 2024-01-08 (×3): 3 mL via INTRAVENOUS

## 2024-01-07 MED ORDER — ONDANSETRON HCL 4 MG/2ML IJ SOLN: 4.0000 mg | Freq: Four times a day (QID) | INTRAMUSCULAR | Status: DC | PRN

## 2024-01-07 MED ORDER — VITAMIN D 25 MCG (1000 UNIT) PO TABS
1000.0000 [IU] | ORAL_TABLET | Freq: Every day | ORAL | Status: DC
  Administered 2024-01-08: 1000 [IU] via ORAL
  Filled 2024-01-07 (×2): qty 1

## 2024-01-07 MED ORDER — VITAMIN B-12 1000 MCG PO TABS
1000.0000 ug | ORAL_TABLET | Freq: Every day | ORAL | Status: DC
  Administered 2024-01-08: 1000 ug via ORAL
  Filled 2024-01-07 (×2): qty 1

## 2024-01-07 MED ORDER — ACETAMINOPHEN 650 MG RE SUPP: 650.0000 mg | Freq: Four times a day (QID) | RECTAL | Status: DC | PRN

## 2024-01-07 MED ORDER — ENOXAPARIN SODIUM 40 MG/0.4ML IJ SOSY
40.0000 mg | PREFILLED_SYRINGE | INTRAMUSCULAR | Status: DC
  Administered 2024-01-07: 40 mg via SUBCUTANEOUS
  Filled 2024-01-07: qty 0.4

## 2024-01-07 MED ORDER — ASPIRIN 325 MG PO TABS
325.0000 mg | ORAL_TABLET | Freq: Once | ORAL | Status: AC
  Administered 2024-01-07: 325 mg via ORAL
  Filled 2024-01-07: qty 1

## 2024-01-07 NOTE — ED Provider Notes (Signed)
 Consult with and patient accepted to hospital service by Dr. Roxanne Gates, MD 01/07/24 [phone]

## 2024-01-07 NOTE — Consult Note (Signed)
 NEUROLOGY CONSULT NOTE   Date of service: January 07, 2024 Patient Name: Donald Savage MRN:  [id number] DOB:  1946/01/09 Chief Complaint: transient memory loss Requesting Provider: Verdene Lennert, MD  History of Present Illness   This is an 78 year old gentleman with past medical history significant for type 2 diabetes, CKD stage III, pernicious anemia associated with vitamin B12 deficiency who presented to the ED with altered mental status.  He remembers arriving at work earlier today and then he next remembers being in the hospital.  Per report he was brought in by his supervisor for acute confusion specifically short-term memory loss.  He kept asking the same questions over and over such as what day it was and thinks that he would typically know.  His symptoms resolved and he has been back to baseline since approximately noon.  He has no focal neurologic deficits and his NIH stroke scale was 0.  Right brain performed in ED showed no acute process.   ROS  Comprehensive ROS performed and pertinent positives documented in HPI   Past History   Past Medical History:  Diagnosis Date   History of kidney stones    Hyperlipidemia    Macrocytic anemia    Pancytopenia (HCC)    Pernicious anemia    POSITIVE parietal cell antibody   Vitamin B12 deficiency     Past Surgical History:  Procedure Laterality Date   CATARACT EXTRACTION W/PHACO Right 02/07/2020   Procedure: CATARACT EXTRACTION PHACO AND INTRAOCULAR LENS PLACEMENT (IOC) RIGHT  TORIC LENS 10.74 00:59.4 ;  Surgeon: Galen Manila, MD;  Location: [facility];  Service: Ophthalmology;  Laterality: Right;   CATARACT EXTRACTION W/PHACO Left 03/06/2020   Procedure: CATARACT EXTRACTION PHACO AND INTRAOCULAR LENS PLACEMENT (IOC) LEFT TORIC LENS 8.47  00:51.6;  Surgeon: Galen Manila, MD;  Location: [facility];  Service: Ophthalmology;  Laterality: Left;   COLONOSCOPY WITH PROPOFOL  2009   XI ROBOTIC ASSISTED INGUINAL  HERNIA REPAIR WITH MESH Right 04/22/2019   Procedure: XI ROBOTIC LAPAROSCOPIC ASSISTED RIGHT INGUINAL HERNIA REPAIR;  Surgeon: Sung Amabile, DO;  Location: [facility];  Service: General;  Laterality: Right;    Family History: Family History  Problem Relation Age of Onset   Heart disease Mother    Cancer Sister        throat   Diabetes Paternal Grandmother     Social History  reports that he has never smoked. He has never used smokeless tobacco. He reports that he does not drink alcohol and does not use drugs.  No Known Allergies  Medications   Current Facility-Administered Medications:    acetaminophen (TYLENOL) tablet 650 mg, 650 mg, Oral, Q6H PRN **OR** acetaminophen (TYLENOL) suppository 650 mg, 650 mg, Rectal, Q6H PRN, Verdene Lennert, MD   enoxaparin (LOVENOX) injection 40 mg, 40 mg, Subcutaneous, Q24H, Huel Cote, Iulia, MD   ondansetron (ZOFRAN) tablet 4 mg, 4 mg, Oral, Q6H PRN **OR** ondansetron (ZOFRAN) injection 4 mg, 4 mg, Intravenous, Q6H PRN, Verdene Lennert, MD   polyethylene glycol (MIRALAX / GLYCOLAX) packet 17 g, 17 g, Oral, Daily PRN, Verdene Lennert, MD   sodium chloride flush (NS) 0.9 % injection 3 mL, 3 mL, Intravenous, Q12H, Verdene Lennert, MD, 3 mL at 01/07/24 1514  Current Outpatient Medications:    celecoxib (CELEBREX) 100 MG capsule, Take 100 mg by mouth 2 (two) times daily., Disp: , Rfl:    cholecalciferol (VITAMIN D3) 25 MCG (1000 UT) tablet, Take 1,000 Units by mouth daily., Disp: , Rfl:  HYDROcodone-acetaminophen (NORCO/VICODIN) 5-325 MG tablet, Take 1 tablet by mouth every 6 (six) hours as needed for moderate pain or severe pain., Disp: 20 tablet, Rfl: 0   Multiple Vitamins-Minerals (PRESERVISION AREDS) TABS, Take 2 tablets by mouth daily., Disp: , Rfl:    triamcinolone ointment (KENALOG) 0.5 %, Apply 1 Application topically 2 (two) times daily., Disp: , Rfl:    vitamin B-12 (CYANOCOBALAMIN) 1000 MCG tablet, Take 1,000 mcg by mouth daily., Disp: , Rfl:     ondansetron (ZOFRAN ODT) 4 MG disintegrating tablet, Take 1 tablet (4 mg total) by mouth every 8 (eight) hours as needed. (Patient not taking: Reported on 04/22/2019), Disp: 20 tablet, Rfl: 0   predniSONE (DELTASONE) 20 MG tablet, Take 20 mg by mouth daily. (Patient not taking: Reported on Feb 05, 2024), Disp: , Rfl:    tamsulosin (FLOMAX) 0.4 MG CAPS capsule, Take 1 capsule (0.4 mg total) by mouth daily. (Patient not taking: Reported on 02/01/2020), Disp: 7 capsule, Rfl: 0  Vitals   Vitals:   02-05-24 1230 02/05/2024 1415 02-05-24 1429 Feb 05, 2024 1513  BP: 117/67  (!) 141/98   Pulse: 72 77    Resp: 15 19    Temp:    97.6 F (36.4 C)  TempSrc:    Oral  SpO2: 100% 100%      There is no height or weight on file to calculate BMI.  Physical Exam    Gen: patient lying in bed, NAD CV: extremities appear well-perfused Resp: normal WOB  Neurologic Examination   MS: alert, oriented x4, follows commands Speech: no dysarthria, no aphasia CN: PERRL, VFF, EOMI, sensation intact, face symmetric, hearing intact to voice Motor: 5/5 strength throughout Sensory: SILT Reflexes: 2+ symm with toes down bilat Coordination: FNF intact bilat Gait: deferred  NIHSS = 0  Labs/Imaging/Neurodiagnostic studies   CBC:  Recent Labs  Lab 02-05-24 1023  WBC 9.5  NEUTROABS 7.4  HGB 13.7  HCT 40.9  MCV 91.3  PLT 212   Basic Metabolic Panel:  Lab Results  Component Value Date   NA 139 2024-02-05   K 4.0 Feb 05, 2024   CO2 24 05-Feb-2024   GLUCOSE 102 (H) Feb 05, 2024   BUN 23 2024/02/05   CREATININE 1.21 02-05-2024   CALCIUM 9.3 2024/02/05   GFRNONAA >60 02/05/24   GFRAA 53 (L) 04/08/2019   Lipid Panel:  Lab Results  Component Value Date   LDLCALC 125 (H) 01/29/2017   HgbA1c: No results found for: "HGBA1C" Urine Drug Screen:     Component Value Date/Time   LABOPIA POSITIVE (A) 05-Feb-2024 1024   COCAINSCRNUR NONE DETECTED Feb 05, 2024 1024   LABBENZ NONE DETECTED Feb 05, 2024 1024   AMPHETMU  NONE DETECTED 02/05/2024 1024   THCU NONE DETECTED Feb 05, 2024 1024   LABBARB NONE DETECTED Feb 05, 2024 1024    Alcohol Level     Component Value Date/Time   ETH <10 Feb 05, 2024 1023   INR No results found for: "INR" APTT No results found for: "APTT" AED levels: No results found for: "PHENYTOIN", "ZONISAMIDE", "LAMOTRIGINE", "LEVETIRACETA"   MRI Brain No acute intracranial abnormality.   Nonspecific white matter signal abnormality, likely reflecting mild chronic microvascular ischemic changes.   Mild parenchymal volume loss.  Impression   This is an 78 year old gentleman with past medical history significant for type 2 diabetes, CKD stage III, pernicious anemia associated with vitamin B12 deficiency who presented to the ED with acute onset short-term memory impairment, repeatedly asking same questions over and over again, which has now resolved. He is completely back to  baseline with no cognitive impairment or focal neurologic deficits. Presentation is most consistent with classic transient global amnesia.  Recommendations   - Admit for TGA workup - No indication for permissive HTN in setting of normal MRI brain - CTA or MRA H&N - Aspirin 325mg  f/b 81mg  daily - TTE  - rEEG - Check A1c and LDL + add statin per guidelines - q4 hr neuro checks - STAT head CT for any change in neuro exam - Tele - PT/OT/SLP - Stroke education - Amb referral to neurology upon discharge    I will continue to follow ______________________________________________________________________    Signed, Jefferson Fuel, MD [facility]

## 2024-01-07 NOTE — ED Notes (Signed)
 Pt back from Ct with this RN. Pt reporting needing to urinate. Pt assisted to standing position at bedside. Pt used urinal without difficulty. No dizziness reported. Pt steady on feet.

## 2024-01-07 NOTE — Assessment & Plan Note (Signed)
 Recently seen by orthopedic surgery regarding right shoulder pain, suspected to be due to rotator cuff arthropathy and arthritis.  He was given a short course of Norco and started on celecoxib.  UDS notable for opioids consistent with this recent prescription.  PDMP reviewed.

## 2024-01-07 NOTE — Assessment & Plan Note (Signed)
 History of CKD stage IIIa with creatinine ranging between 1.2 and 1.4.  Currently at baseline.

## 2024-01-07 NOTE — ED Provider Notes (Signed)
 [facility] Provider Note    Event Date/Time   First MD Initiated Contact with Patient 01/07/24 1018     (approximate)   History   Altered Mental Status   HPI  Donald Savage is a 78 y.o. male who reports no major medical history.  Elevated cholesterol.  Lives at home by himself  The patient reports no medical concern.  He reports that he feels well and has no complaints.  He specifically denies headache trouble speaking weakness numbness chest pain abdominal pain fevers or any other symptom  Patient last known to be well at 3 PM yesterday roughly when he left work.  Since coming into work this morning staff had felt like he was not acting himself, EMS reports he was asking repetitive questions.   Reports he drove himself to work today.  Not able to recall the date  Physical Exam   Triage Vital Signs: ED Triage Vitals  Encounter Vitals Group     BP 01/07/24 1016 (!) 180/83     Systolic BP Percentile --      Diastolic BP Percentile --      Pulse Rate 01/07/24 1016 74     Resp 01/07/24 1016 15     Temp 01/07/24 1016 98.3 F (36.8 C)     Temp Source 01/07/24 1016 Oral     SpO2 01/07/24 1016 99 %     Weight --      Height --      Head Circumference --      Peak Flow --      Pain Score 01/07/24 1014 0     Pain Loc --      Pain Education --      Exclude from Growth Chart --     Most recent vital signs: Vitals:   01/07/24 1100 01/07/24 1130  BP: (!) 123/91 (!) 146/76  Pulse: 81 68  Resp: (!) 25 (!) 23  Temp:    SpO2: 99% 100%     General: Awake, no distress.  Stroke VAN negative, no weakness. Cranial nerve exam normal.  Symmetric smile.  Extraocular movements are normal.  No visual field deficits.  Moves all extremities well.  No pronator drift.  Normal sensation all extremities.  Reads face of a clock with accuracy without difficulty.  No inattention or extinction. CV:  Good peripheral perfusion.  Normal tones and rate Resp:  Normal  effort.  There are bilateral Abd:  No distention.  Soft nontender nondistended Other:     ED Results / Procedures / Treatments   Labs (all labs ordered are listed, but only abnormal results are displayed) Labs Reviewed  COMPREHENSIVE METABOLIC PANEL WITH GFR - Abnormal; Notable for the following components:      Result Value   Glucose, Bld 102 (*)    All other components within normal limits  URINALYSIS, ROUTINE W REFLEX MICROSCOPIC - Abnormal; Notable for the following components:   Color, Urine YELLOW (*)    APPearance CLEAR (*)    All other components within normal limits  CBC WITH DIFFERENTIAL/PLATELET  ETHANOL  URINE DRUG SCREEN, QUALITATIVE ([facility] ONLY)  TROPONIN I (HIGH SENSITIVITY)  TROPONIN I (HIGH SENSITIVITY)     EKG  And interpreted by me at 1020 heart rate 80 QRS 90 QTc 488 Normal ST segments.  Rhythm is somewhat indeterminant.  Narrow complex.  Sinus rhythm versus atrial fibrillation.  No clearly discernible P waves are noted, though fairly regular with intermittent RR  variation.  Suspicious for possible A-fib   RADIOLOGY   CT head inter by me as grossly negative for acute hemorrhage  CT Head Wo Contrast Result Date: 01/07/2024 CLINICAL DATA:  Altered mental status, nontraumatic (Ped 0-17y) EXAM: CT HEAD WITHOUT CONTRAST TECHNIQUE: Contiguous axial images were obtained from the base of the skull through the vertex without intravenous contrast. RADIATION DOSE REDUCTION: This exam was performed according to the departmental dose-optimization program which includes automated exposure control, adjustment of the mA and/or kV according to patient size and/or use of iterative reconstruction technique. COMPARISON:  None Available. FINDINGS: Brain: Store No acute intracranial abnormality. Specifically, no hemorrhage, hydrocephalus, mass lesion, acute infarction, or significant intracranial injury. Vascular: No hyperdense vessel or unexpected calcification. Skull: No acute  calvarial abnormality. Sinuses/Orbits: No acute findings Other: None IMPRESSION: No acute intracranial abnormality. Electronically Signed   By: Charlett Nose M.D.   On: 01/07/2024 11:39     CT Head Wo Contrast Result Date: 01/07/2024 CLINICAL DATA:  Altered mental status, nontraumatic (Ped 0-17y) EXAM: CT HEAD WITHOUT CONTRAST TECHNIQUE: Contiguous axial images were obtained from the base of the skull through the vertex without intravenous contrast. RADIATION DOSE REDUCTION: This exam was performed according to the departmental dose-optimization program which includes automated exposure control, adjustment of the mA and/or kV according to patient size and/or use of iterative reconstruction technique. COMPARISON:  None Available. FINDINGS: Brain: Store No acute intracranial abnormality. Specifically, no hemorrhage, hydrocephalus, mass lesion, acute infarction, or significant intracranial injury. Vascular: No hyperdense vessel or unexpected calcification. Skull: No acute calvarial abnormality. Sinuses/Orbits: No acute findings Other: None IMPRESSION: No acute intracranial abnormality. Electronically Signed   By: Charlett Nose M.D.   On: 01/07/2024 11:39      PROCEDURES:  Critical Care performed: No  Procedures   MEDICATIONS ORDERED IN ED: Medications - No data to display   IMPRESSION / MDM / ASSESSMENT AND PLAN / ED COURSE  I reviewed the triage vital signs and the nursing notes.                              Differential diagnosis includes, but is not limited to, possible metabolic encephalopathic abnormality, dementia, mass lesion, stroke, possibly new onset atrial fibrillation raising possibility for embolic phenomenon, urinary tract infection occult infection electrolyte abnormality etc.  Differential diagnosis quite broad.  It appears his last known well was 3 PM yesterday.  He does not have evidence of large vessel occlusion.    Patient's presentation is most consistent with acute  complicated illness / injury requiring diagnostic workup.  The patient is on the cardiac monitor to evaluate for evidence of arrhythmia and/or significant heart rate changes.  CBC and comprehensive metabolic panel were normal.----------------------------------------- 12:32 PM on 01/07/2024 ----------------------------------------- Repeat EKG performed at 1220 heart rate 60 QRS 80 QTc 440.  Normal sinus rhythm occasional PAC.  No evidence of acute ischemia  Discussed with them consult to be provided by Dr. Selina Cooley.  She advises MRI and that EEG would be advisable studies and admission is recommended.  I am agreeable with this.  I discussed with the patient he is also agreeable and understanding of plan for admission.  His orientation has improved now he relates that he recalls his wife died 2 months ago, he is able to tell me the year and the month.  He does not seem to be exhibiting ongoing confusion except coworker at the bedside does report he is  occasionally still asking some repetitive questions        FINAL CLINICAL IMPRESSION(S) / ED DIAGNOSES   Final diagnoses:  Amnesia memory loss     Rx / DC Orders   ED Discharge Orders     None        Note:  This document was prepared using Dragon voice recognition software and may include unintentional dictation errors.   Sharyn Creamer, MD 01/07/24 1233

## 2024-01-07 NOTE — Assessment & Plan Note (Signed)
 History of pernicious anemia with associated vitamin B12 deficiency.  Last checked in January 2025, at which time vitamin B12 was within normal limits.

## 2024-01-07 NOTE — ED Notes (Signed)
 Patient transported to MRI

## 2024-01-07 NOTE — Assessment & Plan Note (Signed)
 History of diet-controlled type 2 diabetes with last A1c of 6.2% approximately 2 months ago.  No indication for SSI.

## 2024-01-07 NOTE — ED Notes (Signed)
 Patient transported to CT

## 2024-01-07 NOTE — ED Notes (Signed)
EEG being performed at bedside

## 2024-01-07 NOTE — ED Notes (Signed)
 CCMD called and pt placed on cardiac monitoring.

## 2024-01-07 NOTE — Progress Notes (Signed)
 Eeg done

## 2024-01-07 NOTE — H&P (Addendum)
 History and Physical    Patient: Donald Savage ZOX:[id number] DOB: 07-19-1946 DOA: 01/07/2024 DOS: the patient was seen and examined on 01/07/2024 PCP: [facility]  Patient coming from: Home  Chief Complaint:  Chief Complaint  Patient presents with   Altered Mental Status   HPI: Donald Savage is a 78 y.o. male with medical history significant of Type 2 diabetes, CKD stage IIIa, pernicious anemia with associated vitamin B12 deficiency, who presents to the ED due to altered mental status.  Donald Savage states that he remembers arriving at work earlier today, and then his next memory is being here in the hospital.  He does not remember how he got here.  He states prior to today, he is in his normal state of health with no recent illness, chest pain, shortness of breath, dizziness, headache, nausea, vomiting, diarrhea, abdominal pain.  He notes that he has been under a lot of stress as his wife recently passed away and he has been dealing with financial difficulties.  Per chart review, patient's coworkers called EMS earlier after patient was disoriented at work and repeatedly asking the same questions.  Patient sister at bedside expresses concern that this is stress related.  ED course: On arrival to the ED, patient was normotensive at 123/91 with heart rate of 76.  He was saturating at 99% on room air.  He was afebrile at 98.3.  Initial workup notable for unremarkable CBC, CMP, troponin, urinalysis, alcohol level, and CT head.  Neurology consulted and recommended MRI and possible EEG.  [facility] contacted for admission.   Review of Systems: As mentioned in the history of present illness. All other systems reviewed and are negative.  Past Medical History:  Diagnosis Date   History of kidney stones    Hyperlipidemia    Macrocytic anemia    Pancytopenia (HCC)    Pernicious anemia    POSITIVE parietal cell antibody   Vitamin B12 deficiency    Past Surgical History:  Procedure Laterality  Date   CATARACT EXTRACTION W/PHACO Right 02/07/2020   Procedure: CATARACT EXTRACTION PHACO AND INTRAOCULAR LENS PLACEMENT (IOC) RIGHT  TORIC LENS 10.74 00:59.4 ;  Surgeon: Galen Manila, MD;  Location: [facility];  Service: Ophthalmology;  Laterality: Right;   CATARACT EXTRACTION W/PHACO Left 03/06/2020   Procedure: CATARACT EXTRACTION PHACO AND INTRAOCULAR LENS PLACEMENT (IOC) LEFT TORIC LENS 8.47  00:51.6;  Surgeon: Galen Manila, MD;  Location: [facility];  Service: Ophthalmology;  Laterality: Left;   COLONOSCOPY WITH PROPOFOL  2009   XI ROBOTIC ASSISTED INGUINAL HERNIA REPAIR WITH MESH Right 04/22/2019   Procedure: XI ROBOTIC LAPAROSCOPIC ASSISTED RIGHT INGUINAL HERNIA REPAIR;  Surgeon: Sung Amabile, DO;  Location: [facility];  Service: General;  Laterality: Right;   Social History:  reports that he has never smoked. He has never used smokeless tobacco. He reports that he does not drink alcohol and does not use drugs.  No Known Allergies  Family History  Problem Relation Age of Onset   Heart disease Mother    Cancer Sister        throat   Diabetes Paternal Grandmother     Prior to Admission medications   Medication Sig Start Date End Date Taking? Authorizing Provider  celecoxib (CELEBREX) 100 MG capsule Take 100 mg by mouth 2 (two) times daily. 01/05/24 02/04/24 Yes [provider]  cholecalciferol (VITAMIN D3) 25 MCG (1000 UT) tablet Take 1,000 Units by mouth daily.   Yes [provider]  HYDROcodone-acetaminophen (  NORCO/VICODIN) 5-325 MG tablet Take 1 tablet by mouth every 6 (six) hours as needed for moderate pain or severe pain. 04/08/19  Yes Jene Every, MD  Multiple Vitamins-Minerals (PRESERVISION AREDS) TABS Take 2 tablets by mouth daily.   Yes [provider]  triamcinolone ointment (KENALOG) 0.5 % Apply 1 Application topically 2 (two) times daily. 10/20/23 10/19/24 Yes [provider]  vitamin B-12 (CYANOCOBALAMIN) 1000 MCG  tablet Take 1,000 mcg by mouth daily.   Yes [provider]  ondansetron (ZOFRAN ODT) 4 MG disintegrating tablet Take 1 tablet (4 mg total) by mouth every 8 (eight) hours as needed. Patient not taking: Reported on 04/22/2019 04/08/19   Jene Every, MD  predniSONE (DELTASONE) 20 MG tablet Take 20 mg by mouth daily. Patient not taking: Reported on 01/07/2024 12/26/23   [provider]  tamsulosin (FLOMAX) 0.4 MG CAPS capsule Take 1 capsule (0.4 mg total) by mouth daily. Patient not taking: Reported on 02/01/2020 04/08/19   Jene Every, MD    Physical Exam: Vitals:   01/07/24 1030 01/07/24 1100 01/07/24 1130 01/07/24 1230  BP: (!) 159/75 (!) 123/91 (!) 146/76 117/67  Pulse: 71 81 68 72  Resp: 19 (!) 25 (!) 23 15  Temp:      TempSrc:      SpO2: 99% 99% 100% 100%   Physical Exam Vitals and nursing note reviewed.  Constitutional:      General: He is not in acute distress.    Appearance: He is normal weight. He is not toxic-appearing.  HENT:     Head: Normocephalic and atraumatic.     Mouth/Throat:     Mouth: Mucous membranes are moist.     Pharynx: Oropharynx is clear.  Eyes:     Conjunctiva/sclera: Conjunctivae normal.     Pupils: Pupils are equal, round, and reactive to light.  Cardiovascular:     Rate and Rhythm: Normal rate and regular rhythm.     Heart sounds: No murmur heard.    No gallop.  Pulmonary:     Effort: Pulmonary effort is normal. No respiratory distress.     Breath sounds: Normal breath sounds. No wheezing, rhonchi or rales.  Abdominal:     General: Bowel sounds are normal. There is no distension.     Palpations: Abdomen is soft.     Tenderness: There is no abdominal tenderness. There is no guarding.  Musculoskeletal:     Right lower leg: No edema.     Left lower leg: No edema.  Skin:    General: Skin is warm and dry.  Neurological:     General: No focal deficit present.     Mental Status: He is alert and oriented to person, place, and  time. Mental status is at baseline.     Cranial Nerves: No cranial nerve deficit.     Sensory: No sensory deficit.     Motor: No weakness.     Coordination: Coordination normal.     Gait: Gait normal.  Psychiatric:        Mood and Affect: Mood normal.        Behavior: Behavior normal.    Data Reviewed: CBC with WBC of 9.5, hemoglobin of 13.7, platelets of 212 CMP with sodium of 139, potassium 4.0, bicarb 24, glucose 102, BUN 23, creatinine 1.21, AST 17, ALT 16, GFR above 60 Troponin 5 Urinalysis with no abnormalities. Alcohol level negative  EKG per only reviewed.  Sinus rhythm with rate of 77.  Notched P wave.  No evidence of A-fib.  CT Head Wo Contrast Result Date: 01/07/2024 CLINICAL DATA:  Altered mental status, nontraumatic (Ped 0-17y) EXAM: CT HEAD WITHOUT CONTRAST TECHNIQUE: Contiguous axial images were obtained from the base of the skull through the vertex without intravenous contrast. RADIATION DOSE REDUCTION: This exam was performed according to the departmental dose-optimization program which includes automated exposure control, adjustment of the mA and/or kV according to patient size and/or use of iterative reconstruction technique. COMPARISON:  None Available. FINDINGS: Brain: Store No acute intracranial abnormality. Specifically, no hemorrhage, hydrocephalus, mass lesion, acute infarction, or significant intracranial injury. Vascular: No hyperdense vessel or unexpected calcification. Skull: No acute calvarial abnormality. Sinuses/Orbits: No acute findings Other: None IMPRESSION: No acute intracranial abnormality. Electronically Signed   By: Charlett Nose M.D.   On: 01/07/2024 11:39   Results are pending, will review when available.  Assessment and Plan:  * Altered mental status Patient presented to work today, where coworkers noted he was disoriented compared to baseline and repeatedly asking the same questions with associated memory loss.  Since arriving to the ED, patient's  mental status has improved nearly back to baseline.  He is now oriented x 3.  Given recent passing of his wife, question if this is a stress associated transient global amnesia given workup is otherwise completely normal.  Neurology is recommending MRI with possible EEG.  - Neurology consulted; appreciate their recommendations - MRI without contrast pending - EEG per neurology - Neurochecks every 4 hours - Telemetry monitoring  Right shoulder pain Recently seen by orthopedic surgery regarding right shoulder pain, suspected to be due to rotator cuff arthropathy and arthritis.  He was given a short course of Norco and started on celecoxib.  UDS notable for opioids consistent with this recent prescription.  PDMP reviewed.  Stage 3a chronic kidney disease (HCC) History of CKD stage IIIa with creatinine ranging between 1.2 and 1.4.  Currently at baseline.  Controlled type 2 diabetes mellitus with complication, without long-term current use of insulin (HCC) History of diet-controlled type 2 diabetes with last A1c of 6.2% approximately 2 months ago.  No indication for SSI.  B12 deficiency History of pernicious anemia with associated vitamin B12 deficiency.  Last checked in January 2025, at which time vitamin B12 was within normal limits.  Advance Care Planning:   Code Status: Do not attempt resuscitation (DNR) PRE-ARREST INTERVENTIONS DESIRED confirmed by patient with his sister at bedside  Consults: Neurology  Family Communication: Patient's sister updated at bedside.   Severity of Illness: The appropriate patient status for this patient is OBSERVATION. Observation status is judged to be reasonable and necessary in order to provide the required intensity of service to ensure the patient's safety. The patient's presenting symptoms, physical exam findings, and initial radiographic and laboratory data in the context of their medical condition is felt to place them at decreased risk for further  clinical deterioration. Furthermore, it is anticipated that the patient will be medically stable for discharge from the hospital within 2 midnights of admission.   Author: Verdene Lennert, MD 01/07/2024 2:11 PM  For on call review [email or web address].

## 2024-01-07 NOTE — Assessment & Plan Note (Addendum)
 Patient presented to work today, where coworkers noted he was disoriented compared to baseline and repeatedly asking the same questions with associated memory loss.  Since arriving to the ED, patient's mental status has improved nearly back to baseline.  He is now oriented x 3.  Given recent passing of his wife, question if this is a stress associated transient global amnesia given workup is otherwise completely normal.  Neurology is recommending MRI with possible EEG.  - Neurology consulted; appreciate their recommendations - MRI without contrast pending - EEG per neurology - Neurochecks every 4 hours - Telemetry monitoring

## 2024-01-07 NOTE — ED Triage Notes (Signed)
 Pt arrives via [facility] from work for AMS. Pt oriented to self & location. Per EMS, pt has been asking repeat questions. Pt's wife died 2 months ago and he lives alone. Pt was seen at 3pm yesterday normal. No known medical hx.   CBG 114 190/90

## 2024-01-08 ENCOUNTER — Observation Stay (HOSPITAL_BASED_OUTPATIENT_CLINIC_OR_DEPARTMENT_OTHER)
Admit: 2024-01-08 | Discharge: 2024-01-08 | Disposition: A | Discharge status: Home / Self Care | Attending: Internal Medicine | Admitting: Internal Medicine

## 2024-01-08 DIAGNOSIS — R569 Unspecified convulsions: Secondary | ICD-10-CM

## 2024-01-08 DIAGNOSIS — N1831 Chronic kidney disease, stage 3a: Secondary | ICD-10-CM | POA: Diagnosis not present

## 2024-01-08 DIAGNOSIS — E118 Type 2 diabetes mellitus with unspecified complications: Secondary | ICD-10-CM | POA: Diagnosis not present

## 2024-01-08 DIAGNOSIS — M25511 Pain in right shoulder: Secondary | ICD-10-CM | POA: Diagnosis not present

## 2024-01-08 DIAGNOSIS — E538 Deficiency of other specified B group vitamins: Secondary | ICD-10-CM | POA: Diagnosis not present

## 2024-01-08 DIAGNOSIS — R413 Other amnesia: Secondary | ICD-10-CM | POA: Diagnosis not present

## 2024-01-08 DIAGNOSIS — G8929 Other chronic pain: Secondary | ICD-10-CM | POA: Diagnosis not present

## 2024-01-08 DIAGNOSIS — R4182 Altered mental status, unspecified: Secondary | ICD-10-CM | POA: Diagnosis not present

## 2024-01-08 DIAGNOSIS — G459 Transient cerebral ischemic attack, unspecified: Secondary | ICD-10-CM

## 2024-01-08 LAB — ECHOCARDIOGRAM COMPLETE
AR max vel: 2.31 cm2
AV Area VTI: 2.05 cm2
AV Area mean vel: 1.98 cm2
AV Mean grad: 3 mmHg
AV Peak grad: 4.9 mmHg
Ao pk vel: 1.11 m/s
Area-P 1/2: 3.36 cm2
Calc EF: 61.5 %
MV VTI: 1.66 cm2
S' Lateral: 2.7 cm
Single Plane A2C EF: 61.8 %
Single Plane A4C EF: 62.2 %

## 2024-01-08 LAB — LIPID PANEL
Cholesterol: 149 mg/dL (ref 0–200)
HDL: 43 mg/dL (ref >40)
LDL Cholesterol: 89 mg/dL (ref 0–99)
Total CHOL/HDL Ratio: 3.5 ratio
Triglycerides: 87 mg/dL (ref <150)
VLDL: 17 mg/dL (ref 0–40)

## 2024-01-08 LAB — HEMOGLOBIN A1C
Hgb A1c MFr Bld: 6.3 % — ABNORMAL HIGH (ref 4.8–5.6)
Mean Plasma Glucose: 134 mg/dL

## 2024-01-08 MED ORDER — ASPIRIN 81 MG PO TBEC: 81.0000 mg | DELAYED_RELEASE_TABLET | Freq: Every day | ORAL | 12 refills | Status: AC

## 2024-01-08 NOTE — Progress Notes (Signed)
*  PRELIMINARY RESULTS* Echocardiogram 2D Echocardiogram has been performed.  Carolyne Fiscal 01/08/2024, 11:28 AM

## 2024-01-08 NOTE — Discharge Summary (Signed)
 Physician Discharge Summary   Patient: Donald Savage MRN: [id number] DOB: 1945-12-09  Admit date:     01/07/2024  Discharge date: 01/08/24  Discharge Physician: Aisha Hove   PCP: [facility]   Recommendations at discharge:    PCP follow up in 1 week. Neurology follow up as scheduled.  Discharge Diagnoses: Principal Problem:   Altered mental status Active Problems:   B12 deficiency   Controlled type 2 diabetes mellitus with complication, without long-term current use of insulin (HCC)   Stage 3a chronic kidney disease (HCC)   Right shoulder pain  Resolved Problems:   * No resolved hospital problems. *  Hospital Course: Donald Savage is a 78 y.o. male with medical history significant of Type 2 diabetes, CKD stage IIIa, pernicious anemia with associated vitamin B12 deficiency, who presents to the ED due to altered mental status.   Donald Savage states that he remembers arriving at work earlier today, and then his next memory is being here in the hospital.  He does not remember how he got here.  He states prior to today, he is in his normal state of health with no recent illness, chest pain, shortness of breath, dizziness, headache, nausea, vomiting, diarrhea, abdominal pain.  He notes that he has been under a lot of stress as his wife recently passed away and he has been dealing with financial difficulties.  Assessment and Plan: * Transient confusion Multifactorial in the setting of recent use of opiates, steroids for pain, lot of stress in life due to recent death of his wife. Advised to stop taking opiates, steroids. He is on them for shoulder pain from injury at work. MRI brain unremarkable. Echo done read is pending. EEG as outpatient. Neurology consulted; advised Aspirin, outpatient neurology follow up He is advised to follow up with PCP upon discharge. He understands and agrees.  Right shoulder pain Recently seen by orthopedic surgery regarding right shoulder  pain, suspected to be due to rotator cuff arthropathy and arthritis.  He was given a short course of Norco and started on celecoxib.  UDS notable for opioids consistent with this recent prescription.  PDMP reviewed. Advised tylenol for pain, as opiates can cause confusion, he understands.  Stage 3a chronic kidney disease (HCC) History of CKD stage IIIa with creatinine ranging between 1.2 and 1.4.  Currently at baseline.  Controlled type 2 diabetes mellitus with complication, without long-term current use of insulin (HCC) History of diet-controlled type 2 diabetes with last A1c of 6.2% approximately 2 months ago.  No indication for SSI.  B12 deficiency History of pernicious anemia with associated vitamin B12 deficiency.  Last checked in January 2025, at which time vitamin B12 was within normal limits.       Consultants: Neurology Procedures performed: none  Disposition: Home Diet recommendation:  Discharge Diet Orders (From admission, onward)     Start     Ordered   01/08/24 0000  Diet - low sodium heart healthy        01/08/24 1455           Cardiac diet DISCHARGE MEDICATION: Allergies as of 01/08/2024   No Known Allergies      Medication List     STOP taking these medications    celecoxib 100 MG capsule Commonly known as: CELEBREX   HYDROcodone-acetaminophen 5-325 MG tablet Commonly known as: NORCO/VICODIN   predniSONE 20 MG tablet Commonly known as: DELTASONE       TAKE these medications  aspirin EC 81 MG tablet Take 1 tablet (81 mg total) by mouth daily. Swallow whole. Start taking on: January 09, 2024   cholecalciferol 25 MCG (1000 UNIT) tablet Commonly known as: VITAMIN D3 Take 1,000 Units by mouth daily.   cyanocobalamin 1000 MCG tablet Commonly known as: VITAMIN B12 Take 1,000 mcg by mouth daily.   ondansetron 4 MG disintegrating tablet Commonly known as: Zofran ODT Take 1 tablet (4 mg total) by mouth every 8 (eight) hours as needed.    PreserVision AREDS Tabs Take 2 tablets by mouth daily.   tamsulosin 0.4 MG Caps capsule Commonly known as: Flomax Take 1 capsule (0.4 mg total) by mouth daily.   triamcinolone ointment 0.5 % Commonly known as: KENALOG Apply 1 Application topically 2 (two) times daily.        Discharge Exam:    01/08/2024   11:55 AM 01/08/2024    9:30 AM 01/08/2024    4:07 AM  Vitals with BMI  Systolic 113 145 098  Diastolic 58 76 64  Pulse 57 81 67    General - Elderly Caucasian male, no apparent distress HEENT - PERRLA, EOMI, atraumatic head, non tender sinuses. Lung - Clear, no rales, rhonchi, wheezes. Heart - S1, S2 heard, no murmurs, rubs, trace pedal edema. Abdomen - Soft, non tender, bowel sounds good Neuro - Alert, awake and oriented x 3, non focal exam. Skin - Warm and dry.  Condition at discharge: stable  The results of significant diagnostics from this hospitalization (including imaging, microbiology, ancillary and laboratory) are listed below for reference.   Imaging Studies: CT ANGIO HEAD NECK W WO CM Result Date: 01/07/2024 CLINICAL DATA:  Initial evaluation for acute neuro deficit, stroke suspected. EXAM: CT ANGIOGRAPHY HEAD AND NECK WITH AND WITHOUT CONTRAST TECHNIQUE: Multidetector CT imaging of the head and neck was performed using the standard protocol during bolus administration of intravenous contrast. Multiplanar CT image reconstructions and MIPs were obtained to evaluate the vascular anatomy. Carotid stenosis measurements (when applicable) are obtained utilizing NASCET criteria, using the distal internal carotid diameter as the denominator. RADIATION DOSE REDUCTION: This exam was performed according to the departmental dose-optimization program which includes automated exposure control, adjustment of the mA and/or kV according to patient size and/or use of iterative reconstruction technique. CONTRAST:  75mL OMNIPAQUE IOHEXOL 350 MG/ML SOLN COMPARISON:  CT and MRI from  earlier the same day. FINDINGS: CTA NECK FINDINGS Aortic arch: Visualized aortic arch within normal limits for caliber with standard branch pattern. Mild aortic atherosclerosis. No stenosis about the origin of the great vessels. Right carotid system: Right common and internal carotid arteries are patent without dissection. Mild atheromatous change about the right carotid bulb without stenosis. Left carotid system: Left common and internal carotid arteries are patent without dissection. Mild atheromatous change about the left carotid bulb without hemodynamically significant greater than 50% stenosis. Vertebral arteries: Both vertebral arteries arise from subclavian arteries. Left vertebral artery slightly dominant. Vertebral arteries are patent without stenosis or dissection. Skeleton: No worrisome osseous lesions. Moderate cervical spondylosis. Other neck: No other acute finding. Upper chest: No other acute finding. Review of the MIP images confirms the above findings CTA HEAD FINDINGS Anterior circulation: Mild atheromatous change about the carotid siphons without hemodynamically significant stenosis. A1 segments patent bilaterally. Normal anterior communicating artery complex. Anterior cerebral arteries patent without significant stenosis. No M1 stenosis or occlusion. Distal MCA branches perfused and fairly symmetric. Posterior circulation: Both V4 segments patent without stenosis. Left PICA patent. Right PICA not  well seen. Basilar patent without stenosis. Superior cerebral arteries patent bilaterally. Both PCAs primarily supplied via the basilar. Right PCA widely patent to its distal aspect without stenosis. Focal moderate stenosis at the left P1/P2 junction (series 7, image 111). Left PCA mildly irregular but otherwise patent distally. Venous sinuses: Patent allowing for timing the contrast bolus. Anatomic variants: As above.  No aneurysm. Review of the MIP images confirms the above findings IMPRESSION: 1.  Negative CTA for large vessel occlusion or other emergent finding. 2. Focal moderate stenosis at the left P1/P2 junction. 3. Mild atheromatous change about the carotid bifurcations and carotid siphons without hemodynamically significant stenosis. 4.  Aortic Atherosclerosis (ICD10-I70.0). Electronically Signed   By: Virgia Griffins M.D.   On: 01/07/2024 21:24   MR BRAIN WO CONTRAST Result Date: 01/07/2024 CLINICAL DATA:  Neuro deficit, concern for stroke, altered mental status. EXAM: MRI HEAD WITHOUT CONTRAST TECHNIQUE: Multiplanar, multiecho pulse sequences of the brain and surrounding structures were obtained without intravenous contrast. COMPARISON:  Same-day head CT. FINDINGS: Brain: No acute infarct. No evidence of intracranial hemorrhage. Nonspecific scattered foci of T2/FLAIR signal abnormality in the periventricular and subcortical white matter. No mass lesion or midline shift. Cerebellum is unremarkable. Normal appearance of midline structures. The basilar cisterns are patent. No extra-axial fluid collections. Ventricles: Prominence of the lateral ventricles suggestive of underlying parenchymal volume loss. Vascular: Skull base flow voids are visualized. Skull and upper cervical spine: Trace anterolisthesis of C2 on C3 likely related to degenerative changes. The visualized upper cervical spine and calvarium are otherwise unremarkable. Sinuses/Orbits: Orbits are symmetric. Paranasal sinuses are clear. Other: Mastoid air cells are clear. IMPRESSION: No acute intracranial abnormality. Nonspecific white matter signal abnormality, likely reflecting mild chronic microvascular ischemic changes. Mild parenchymal volume loss. Electronically Signed   By: Denny Flack M.D.   On: 01/07/2024 14:48   CT Head Wo Contrast Result Date: 01/07/2024 CLINICAL DATA:  Altered mental status, nontraumatic (Ped 0-17y) EXAM: CT HEAD WITHOUT CONTRAST TECHNIQUE: Contiguous axial images were obtained from the base of the  skull through the vertex without intravenous contrast. RADIATION DOSE REDUCTION: This exam was performed according to the departmental dose-optimization program which includes automated exposure control, adjustment of the mA and/or kV according to patient size and/or use of iterative reconstruction technique. COMPARISON:  None Available. FINDINGS: Brain: Store No acute intracranial abnormality. Specifically, no hemorrhage, hydrocephalus, mass lesion, acute infarction, or significant intracranial injury. Vascular: No hyperdense vessel or unexpected calcification. Skull: No acute calvarial abnormality. Sinuses/Orbits: No acute findings Other: None IMPRESSION: No acute intracranial abnormality. Electronically Signed   By: Janeece Mechanic M.D.   On: 01/07/2024 11:39    Microbiology: Results for orders placed or performed during the hospital encounter of 03/02/20  SARS CORONAVIRUS 2 (TAT 6-24 HRS) Nasopharyngeal Nasopharyngeal Swab     Status: None   Collection Time: 03/02/20 11:34 AM   Specimen: Nasopharyngeal Swab  Result Value Ref Range Status   SARS Coronavirus 2 NEGATIVE NEGATIVE Final    Comment: (NOTE) SARS-CoV-2 target nucleic acids are NOT DETECTED. The SARS-CoV-2 RNA is generally detectable in upper and lower respiratory specimens during the acute phase of infection. Negative results do not preclude SARS-CoV-2 infection, do not rule out co-infections with other pathogens, and should not be used as the sole basis for treatment or other patient management decisions. Negative results must be combined with clinical observations, patient history, and epidemiological information. The expected result is Negative. Fact Sheet for Patients: HairSlick.no Fact Sheet for Healthcare Providers: quierodirigir.com  This test is not yet approved or cleared by the [place]  FDA and  has been authorized for detection and/or diagnosis of SARS-CoV-2  by FDA under an Emergency Use Authorization (EUA). This EUA will remain  in effect (meaning this test can be used) for the duration of the COVID-19 declaration under Section 56 4(b)(1) of the Act, 21 U.S.C. section 360bbb-3(b)(1), unless the authorization is terminated or revoked sooner. Performed at [facility], [address], [place], [place] [address]     Labs: CBC: Recent Labs  Lab 01/07/24 1023  WBC 9.5  NEUTROABS 7.4  HGB 13.7  HCT 40.9  MCV 91.3  PLT 212   Basic Metabolic Panel: Recent Labs  Lab 01/07/24 1023  NA 139  K 4.0  CL 105  CO2 24  GLUCOSE 102*  BUN 23  CREATININE 1.21  CALCIUM 9.3   Liver Function Tests: Recent Labs  Lab 01/07/24 1023  AST 17  ALT 16  ALKPHOS 60  BILITOT 1.1  PROT 6.9  ALBUMIN 3.9   CBG: No results for input(s): "GLUCAP" in the last 168 hours.  Discharge time spent: 35 minutes.  Signed: Aisha Hove, MD [facility] 01/08/2024

## 2024-01-08 NOTE — Care Management Obs Status (Signed)
 MEDICARE OBSERVATION STATUS NOTIFICATION   Patient Details  Name: Donald Savage MRN: [id number] Date of Birth: Jul 03, 1946   Medicare Observation Status Notification Given:  Orland Dec, CMA 01/08/2024, 3:30 PM

## 2024-01-08 NOTE — Care Management Note (Signed)
 Received order to discontinue continuous cardiac monitoring.

## 2024-01-08 NOTE — TOC Transition Note (Signed)
 Transition of Care [facility]) - Discharge Note   Patient Details  Name: Donald Savage MRN: [id number] Date of Birth: 09-Jan-1946  Transition of Care [facility] CM/SW Contact:  Cherre Blanc, RN Phone Number: 01/08/2024, 4:10 PM   Clinical Narrative:      Patient is medically clear for discharge to home. No TOC needs identified.       Patient Goals and CMS Choice            Discharge Placement                       Discharge Plan and Services Additional resources added to the After Visit Summary for                                       Social Drivers of Health (SDOH) Interventions SDOH Screenings   Food Insecurity: Patient Declined (01/07/2024)  Housing: Patient Declined (01/07/2024)  Transportation Needs: Patient Declined (01/07/2024)  Utilities: Patient Declined (01/07/2024)  Financial Resource Strain: Low Risk  (01/05/2024)   Received from [facility]  Social Connections: Unknown (01/07/2024)  Tobacco Use: Low Risk  (12/26/2023)   Received from [facility]     Readmission Risk Interventions     No data to display

## 2024-01-10 NOTE — Procedures (Signed)
 Routine EEG Report  Donald Savage is a 78 y.o. male with a history of altered mental status who is undergoing an EEG to evaluate for seizures.  Report: This EEG was acquired with electrodes placed according to the International 10-20 electrode system (including Fp1, Fp2, F3, F4, C3, C4, P3, P4, O1, O2, T3, T4, T5, T6, A1, A2, Fz, Cz, Pz). The following electrodes were missing or displaced: none.  The occipital dominant rhythm was 8.5 Hz. This activity is reactive to stimulation. Drowsiness was manifested by background fragmentation; deeper stages of sleep were identified by K complexes and sleep spindles. There was no focal slowing. There were no interictal epileptiform discharges. There were no electrographic seizures identified. There was no abnormal response to photic stimulation or hyperventilation.   Impression: This EEG was obtained while awake and asleep and is normal.    Clinical Correlation: Normal EEGs, however, do not rule out epilepsy.  Greg Leaks, MD [facility] [phone]  If 7pm- 7am, please page neurology on call as listed in [facility].

## 2024-01-13 DIAGNOSIS — F4489 Other dissociative and conversion disorders: Secondary | ICD-10-CM | POA: Diagnosis not present

## 2024-04-18 DIAGNOSIS — E538 Deficiency of other specified B group vitamins: Secondary | ICD-10-CM | POA: Diagnosis not present

## 2024-04-18 DIAGNOSIS — N1831 Chronic kidney disease, stage 3a: Secondary | ICD-10-CM | POA: Diagnosis not present

## 2024-04-18 DIAGNOSIS — R7303 Prediabetes: Secondary | ICD-10-CM | POA: Diagnosis not present

## 2024-09-02 ENCOUNTER — Encounter: Payer: Self-pay | Admitting: *Deleted

## 2024-09-02 NOTE — Progress Notes (Signed)
 Donald Savage                                          MRN: [id number]   09/02/2024   The [facility] Quality Team Specialist reviewed this patient medical record for the purposes of chart review for care gap closure. The following were reviewed: chart review for care gap closure-kidney health evaluation for diabetes:eGFR  and uACR.    [facility] Quality Team
